# Patient Record
Sex: Female | Born: 1975 | Race: Black or African American | Hispanic: No | Marital: Single | Smoking: Former smoker
Health system: Southern US, Community
[De-identification: ages and names within clinical notes are randomized; demographics above are authoritative.]

## PROBLEM LIST (undated history)

## (undated) DIAGNOSIS — D649 Anemia, unspecified: Secondary | ICD-10-CM

## (undated) DIAGNOSIS — M1990 Unspecified osteoarthritis, unspecified site: Secondary | ICD-10-CM

## (undated) DIAGNOSIS — I10 Essential (primary) hypertension: Secondary | ICD-10-CM

## (undated) HISTORY — DX: Essential (primary) hypertension: I10

## (undated) HISTORY — PX: CHOLECYSTECTOMY: SHX55

## (undated) HISTORY — DX: Unspecified osteoarthritis, unspecified site: M19.90

## (undated) HISTORY — PX: ANKLE SURGERY: SHX546

## (undated) HISTORY — PX: FRACTURE SURGERY: SHX138

## (undated) HISTORY — DX: Anemia, unspecified: D64.9

---

## 1997-10-17 ENCOUNTER — Emergency Department (HOSPITAL_COMMUNITY): Admission: EM | Admit: 1997-10-17 | Discharge: 1997-10-17 | Payer: Self-pay | Admitting: Emergency Medicine

## 1997-10-27 ENCOUNTER — Emergency Department (HOSPITAL_COMMUNITY): Admission: EM | Admit: 1997-10-27 | Discharge: 1997-10-27 | Payer: Self-pay | Admitting: Emergency Medicine

## 1997-11-06 ENCOUNTER — Observation Stay (HOSPITAL_COMMUNITY): Admission: RE | Admit: 1997-11-06 | Discharge: 1997-11-07 | Payer: Self-pay | Admitting: General Surgery

## 1998-11-14 ENCOUNTER — Inpatient Hospital Stay (HOSPITAL_COMMUNITY): Admission: EM | Admit: 1998-11-14 | Discharge: 1998-11-17 | Payer: Self-pay | Admitting: Emergency Medicine

## 1998-11-14 ENCOUNTER — Encounter: Payer: Self-pay | Admitting: Emergency Medicine

## 1998-11-14 ENCOUNTER — Encounter: Payer: Self-pay | Admitting: Orthopedic Surgery

## 1999-01-07 ENCOUNTER — Encounter: Admission: RE | Admit: 1999-01-07 | Discharge: 1999-01-30 | Payer: Self-pay | Admitting: Orthopedic Surgery

## 2003-10-31 ENCOUNTER — Other Ambulatory Visit: Admission: RE | Admit: 2003-10-31 | Discharge: 2003-10-31 | Payer: Self-pay | Admitting: Obstetrics and Gynecology

## 2004-12-01 ENCOUNTER — Other Ambulatory Visit: Admission: RE | Admit: 2004-12-01 | Discharge: 2004-12-01 | Payer: Self-pay | Admitting: Obstetrics and Gynecology

## 2012-01-11 ENCOUNTER — Encounter: Payer: Self-pay | Admitting: Family Medicine

## 2012-01-11 ENCOUNTER — Ambulatory Visit (INDEPENDENT_AMBULATORY_CARE_PROVIDER_SITE_OTHER): Payer: BC Managed Care – PPO | Admitting: Family Medicine

## 2012-01-11 VITALS — BP 120/75 | HR 73 | Temp 97.9°F | Resp 16 | Ht 65.0 in | Wt 215.0 lb

## 2012-01-11 DIAGNOSIS — D573 Sickle-cell trait: Secondary | ICD-10-CM | POA: Insufficient documentation

## 2012-01-11 DIAGNOSIS — E559 Vitamin D deficiency, unspecified: Secondary | ICD-10-CM | POA: Insufficient documentation

## 2012-01-11 DIAGNOSIS — Z1231 Encounter for screening mammogram for malignant neoplasm of breast: Secondary | ICD-10-CM

## 2012-01-11 DIAGNOSIS — R61 Generalized hyperhidrosis: Secondary | ICD-10-CM

## 2012-01-11 DIAGNOSIS — E669 Obesity, unspecified: Secondary | ICD-10-CM

## 2012-01-11 DIAGNOSIS — Z0000 Encounter for general adult medical examination without abnormal findings: Secondary | ICD-10-CM

## 2012-01-11 DIAGNOSIS — D649 Anemia, unspecified: Secondary | ICD-10-CM | POA: Insufficient documentation

## 2012-01-11 LAB — POCT URINALYSIS DIPSTICK
Bilirubin, UA: NEGATIVE
Blood, UA: NEGATIVE
Glucose, UA: NEGATIVE
Ketones, UA: NEGATIVE
Leukocytes, UA: NEGATIVE
Nitrite, UA: NEGATIVE
Protein, UA: NEGATIVE
Spec Grav, UA: 1.01
Urobilinogen, UA: 0.2
pH, UA: 7

## 2012-01-11 LAB — BASIC METABOLIC PANEL
BUN: 13 mg/dL (ref 6–23)
CO2: 25 mEq/L (ref 19–32)
Calcium: 9.9 mg/dL (ref 8.4–10.5)
Chloride: 104 mEq/L (ref 96–112)
Creat: 0.73 mg/dL (ref 0.50–1.10)
Glucose, Bld: 86 mg/dL (ref 70–99)
Potassium: 4.4 mEq/L (ref 3.5–5.3)
Sodium: 138 mEq/L (ref 135–145)

## 2012-01-11 LAB — CBC WITH DIFFERENTIAL/PLATELET
Basophils Absolute: 0 10*3/uL (ref 0.0–0.1)
Eosinophils Absolute: 0.1 10*3/uL (ref 0.0–0.7)
Lymphs Abs: 1.8 10*3/uL (ref 0.7–4.0)
MCH: 21.1 pg — ABNORMAL LOW (ref 26.0–34.0)
Neutrophils Relative %: 67 % (ref 43–77)
Platelets: 401 10*3/uL — ABNORMAL HIGH (ref 150–400)
RBC: 5.11 MIL/uL (ref 3.87–5.11)
RDW: 17.5 % — ABNORMAL HIGH (ref 11.5–15.5)
WBC: 6.9 10*3/uL (ref 4.0–10.5)

## 2012-01-11 NOTE — Progress Notes (Signed)
Subjective:    Patient ID: Sandra Barker, female    DOB: 1975-12-11, 36 y.o.   MRN: [id number]  HPI  This 36 y.o. AA female is here for CPE (last PAP was 2012- negative). She is single and  works as Special educational needs teacher for [employer]. Her main concerns are nocturnal sweating and need for weight reduction. She has always been a "heavy sweater" but in the last few months, It has been significantly worse at night. She lives with her elderly grandmother who has early stages  of Alzheimer's Dementia; thermostat is set at 75 degrees at night. Pt is interested in referral to  Nutritionist for weight loss advise. She does not exercise because of lack of motivation and chronic  ankle/ joint  Pain.   Review of Systems  Constitutional: Positive for diaphoresis.  Musculoskeletal: Positive for joint swelling and arthralgias.  All other systems reviewed and are negative.       Objective:   Physical Exam  Nursing note and vitals reviewed. Constitutional: She is oriented to person, place, and time. She appears well-developed and well-nourished. No distress.  HENT:  Head: Normocephalic and atraumatic.  Right Ear: Hearing, tympanic membrane, external ear and ear canal normal.  Left Ear: Hearing, tympanic membrane, external ear and ear canal normal.  Nose: Nose normal. No nasal deformity or septal deviation.  Mouth/Throat: Uvula is midline, oropharynx is clear and moist and mucous membranes are normal.  Eyes: Conjunctivae normal, EOM and lids are normal. Pupils are equal, round, and reactive to light. No scleral icterus.  Neck: Normal range of motion. Neck supple. No thyromegaly present.  Cardiovascular: Normal rate, regular rhythm and intact distal pulses.  Exam reveals no gallop and no friction rub.   No murmur heard. Pulmonary/Chest: Effort normal and breath sounds normal. No respiratory distress. She exhibits no tenderness. Right breast exhibits no inverted nipple, no mass, no nipple  discharge, no skin change and no tenderness. Left breast exhibits no inverted nipple, no mass, no nipple discharge, no skin change and no tenderness. Breasts are symmetrical.       Bilaterally dense breasts with upper outer quadrant thickness (right > left)  Abdominal: Soft. Bowel sounds are normal. She exhibits no distension and no mass. There is no hepatosplenomegaly. There is no tenderness. There is no guarding and no CVA tenderness.  Genitourinary:       Deferred  Musculoskeletal: She exhibits tenderness.       Left ankle with swelling over medial malleolus and decreased ROM  Lymphadenopathy:    She has no cervical adenopathy.  Neurological: She is alert and oriented to person, place, and time. She has normal reflexes. No cranial nerve deficit. She exhibits normal muscle tone. Coordination normal.  Skin: Skin is warm and dry. No erythema. No pallor.       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 Her behavior is normal. Judgment and thought content normal.    Results for orders placed in visit on 01/11/12  POCT URINALYSIS DIPSTICK      Component Value Range   Color, UA yellow     Clarity, UA clear     Glucose, UA neg     Bilirubin, UA neg     Ketones, UA neg     Spec Grav, UA 1.010     Blood, UA neg  pH, UA 7.0     Protein, UA neg     Urobilinogen, UA 0.2     Nitrite, UA neg     Leukocytes, UA Negative           Assessment & Plan:   1. Routine general medical examination at a health care facility  POCT urinalysis dipstick  2. Diaphoresis  T4, Free, TSH, T3, Free, Basic metabolic panel  3. Unspecified vitamin D deficiency  Vitamin D, 25-hydroxy  4. Anemia - pt has Sickle Cell trait CBC with Differential  5. Obesity (BMI 35.0-39.9 without comorbidity)  Amb ref to Medical Nutrition Therapy-MNT Advised pt  to focus on "process" of weight loss, not the "outcome"; start increasing activity level by picking an activity that she can commit to 2 days/week  6. Other screening mammogram  MM Digital Screening

## 2012-01-11 NOTE — Patient Instructions (Signed)
Keeping You Healthy  Get These Tests 1. Blood Pressure- Have your blood pressure checked once a year by your health care provider.  Normal blood pressure is 120/80. 2. Weight- Have your body mass index (BMI) calculated to screen for obesity.  BMI is measure of body fat based on height and weight.  You can also calculate your own BMI at [email or web address].west-esparza.com/. 3. Cholesterol- Have your cholesterol checked every 5 years starting at age 38 then yearly starting at age 55. 4. Chlamydia, HIV, and other sexually transmitted diseases- Get screened every year until age 3, then within three months of each new sexual provider. 5. Pap Smear- Every 1-3 years; discuss with your health care provider. 6. Mammogram- Every year starting at age 68  Take these medicines  Calcium with Vitamin D-Your body needs 1200 mg of Calcium each day and [phone] IU of Vitamin D daily.  Your body can only absorb 500 mg of Calcium at a time so Calcium must be taken in 2 or 3 divided doses throughout the day.  Multivitamin with folic acid- Once daily if it is possible for you to become pregnant.  Get these Immunizations  Gardasil-Series of three doses; prevents HPV related illness such as genital warts and cervical cancer.  Menactra-Single dose; prevents meningitis.  Tetanus shot- Every 10 years.  Flu shot-Every year.  Take these steps 1. Do not smoke-Your healthcare provider can help you quit.  For tips on how to quit go to www.smokefree.gov or call 1-800 QUITNOW. 2. Be physically active- Exercise 5 days a week for at least 30 minutes.  If you are not already physically active, start slow and gradually work up to 30 minutes of moderate physical activity.  Examples of moderate activity include walking briskly, dancing, swimming, bicycling, etc. 3. Breast Cancer- A self breast exam every month is important for early detection of breast cancer.  For more information and instruction on self breast exams, ask your  healthcare provider or [email or web address]. 4. Eat a healthy diet- Eat a variety of healthy foods such as fruits, vegetables, whole grains, low fat milk, low fat cheeses, yogurt, lean meats, poultry and fish, beans, nuts, tofu, etc.  For more information go to www. Thenutritionsource.org 5. Drink alcohol in moderation- Limit alcohol intake to one drink or less per day. Never drink and drive. 6. Depression- Your emotional health is as important as your physical health.  If you're feeling down or losing interest in things you normally enjoy please talk to your healthcare provider about being screened for depression. 7. Dental visit- Brush and floss your teeth twice daily; visit your dentist twice a year. 8. Eye doctor- Get an eye exam at least every 2 years. 9. Helmet use- Always wear a helmet when riding a bicycle, motorcycle, rollerblading or skateboarding. 10. Safe sex- If you may be exposed to sexually transmitted infections, use a condom. 11. Seat belts- Seat belts can save your live; always wear one. 12. Smoke/Carbon Monoxide detectors- These detectors need to be installed on the appropriate level of your home. Replace batteries at least once a year. 13. Skin cancer- When out in the sun please cover up and use sunscreen 15 SPF or higher. 14. Violence- If anyone is threatening or hurting you, please tell your healthcare provider.   Exercise to Lose Weight Exercise and a healthy diet may help you lose weight. Your doctor may suggest specific exercises. EXERCISE IDEAS AND TIPS  Choose low-cost things you enjoy doing, such as walking, bicycling, or exercising to  workout videos.  Take stairs instead of the elevator.  Walk during your lunch break.  Park your car further away from work or school.  Go to a gym or an exercise class.  Start with 5 to 10 minutes of exercise each day. Build up to 30 minutes of exercise 4 to 6 days a week.  Wear shoes with good support  and comfortable clothes.  Stretch before and after working out.  Work out until you breathe harder and your heart beats faster.  Drink extra water when you exercise.  Do not do so much that you hurt yourself, feel dizzy, or get very short of breath. Exercises that burn about 150 calories:  Running 1  miles in 15 minutes.  Playing volleyball for 45 to 60 minutes.  Washing and waxing a car for 45 to 60 minutes.  Playing touch football for 45 minutes.  Walking 1  miles in 35 minutes.  Pushing a stroller 1  miles in 30 minutes.  Playing basketball for 30 minutes.  Raking leaves for 30 minutes.  Bicycling 5 miles in 30 minutes.  Walking 2 miles in 30 minutes.  Dancing for 30 minutes.  Shoveling snow for 15 minutes.  Swimming laps for 20 minutes.  Walking up stairs for 15 minutes.  Bicycling 4 miles in 15 minutes.  Gardening for 30 to 45 minutes.  Jumping rope for 15 minutes.  Washing windows or floors for 45 to 60 minutes. Document Released: 04/03/2010 Document Revised: 05/24/2011 Document Reviewed: 04/03/2010 [facility] Patient Information 2013 [place], [place].   Consider joining a fitness program like "[employer]" or [facility]

## 2012-01-12 LAB — VITAMIN D 25 HYDROXY (VIT D DEFICIENCY, FRACTURES): Vit D, 25-Hydroxy: 17 ng/mL — ABNORMAL LOW (ref 30–89)

## 2012-01-12 LAB — T3, FREE: T3, Free: 3.2 pg/mL (ref 2.3–4.2)

## 2012-01-12 LAB — T4, FREE: Free T4: 1.31 ng/dL (ref 0.80–1.80)

## 2012-01-12 LAB — TSH: TSH: 0.999 u[IU]/mL (ref 0.350–4.500)

## 2012-01-13 ENCOUNTER — Other Ambulatory Visit: Payer: Self-pay | Admitting: Family Medicine

## 2012-01-13 MED ORDER — ERGOCALCIFEROL 1.25 MG (50000 UT) PO CAPS: 50000.0000 [IU] | ORAL_CAPSULE | ORAL | Status: DC

## 2012-01-13 NOTE — Progress Notes (Signed)
Quick Note:  Please call pt and advise that the following labs are abnormal... Vitamin D def requires RX for supplement. This will be routed to pharmacy. Pt to take 1 capsule once a week and try to increase Vit D in diet (fish, mushrooms, some dairy products, eggs). Needs to get some sun exposure most days of the week. Also still anemic; get OTC multivitamin for women that has iron in it and take it daily with a meal and orange juice (to help increase iron absorption).  Other labs normal (kidney function and thyroid function).  Copy to pt. ______

## 2012-01-21 ENCOUNTER — Ambulatory Visit (HOSPITAL_COMMUNITY)
Admission: RE | Admit: 2012-01-21 | Discharge: 2012-01-21 | Disposition: A | Discharge status: Home / Self Care | Payer: BC Managed Care – PPO | Source: Ambulatory Visit | Attending: Family Medicine | Admitting: Family Medicine

## 2012-01-21 DIAGNOSIS — Z1231 Encounter for screening mammogram for malignant neoplasm of breast: Secondary | ICD-10-CM

## 2012-02-04 ENCOUNTER — Encounter: Payer: BC Managed Care – PPO | Attending: Family Medicine | Admitting: *Deleted

## 2012-02-04 ENCOUNTER — Encounter: Payer: Self-pay | Admitting: *Deleted

## 2012-02-04 VITALS — Ht 64.5 in | Wt 214.2 lb

## 2012-02-04 DIAGNOSIS — Z713 Dietary counseling and surveillance: Secondary | ICD-10-CM | POA: Insufficient documentation

## 2012-02-04 DIAGNOSIS — E669 Obesity, unspecified: Secondary | ICD-10-CM

## 2012-02-04 NOTE — Progress Notes (Signed)
Medical Nutrition Therapy:  Appt start time: 1000 end time:  1100.   Assessment:  Primary concerns today: weight management.   MEDICATIONS: none   DIETARY INTAKE:  Usual eating pattern includes 2-3 meals and 1-2 snacks per day.  Everyday foods include proteins, starches, some fruits and vegetables.  Avoided foods include eggs.    24-hr recall:  B ( AM): starbucks ice coffee.  Might have yogurt or biscuit .  Sometimes will skip Snk ( AM): not usually  L ( PM): eat out at [employer] salad or another takeout Snk ( PM): chips or grapes or other fruit D ( PM): may cook at home, steak , baked potato, and salad Snk ( PM): sweets in bed- cookies or ice cream Beverages: juice. Sunny D, koolaid or Crystal light Snacks when not hungry while watching tv or at work.  Evokes feelings of guilt.  No compensatory behaviors  Usual physical activity: not really.  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   Nutritional Diagnosis:  Boley-3.3 Overweight/obesity As related to decreased metabolism from chronic dieting an dmeal skipping coupled with limited adherance to internal hunger and satiety cues.  As evidenced by BMI of 36.    Intervention:  Nutrition counseling provided.  Sandra Barker is here because she wants to lose some weight.  She maintained a healthy weight for awhile, but as life got more busy and stressful she started to gain.  She has tried to lose weight in the past via various fad diets, but her weight has yo-yo'ed and eventually crept up.  Encouraged patient to reject traditional diet mentality of "good" vs "bad" foods.  There are no good and bad foods, but rather food is fuel that we needs for our bodies.  When we don't get enough fuel, our bodies suffer the metabolic consequences. Explained to her that by restricting calories too much over the years through  dieting or meal skipping she has actually caused her metabolism to decrease most likely.  Also fat storage increases with meal skipping.  Encouraged patient to eat whatever foods will satisfy them, regardless of their nutritional value.  We will discuss nutritional values of foods at a subsequent appointment.  Encouraged patient to honor their body's internal hunger and fullness cues.  Throughout the day, check in mentally and rate hunger.  Try not to eat when ravenous, but instead when slightly hungry.  Then choose food(s) that will be satisfying regardless of nutritional content.  Sit down to enjoy those foods.  Minimize distractions: turn off tv, put away books, work, Programmer, applications.  Make the meal last at least 20 minutes in order to give time to experience and register satiety.  Stop eating when full regardless of how much food is left on the plate.  Get more if still hungry.  The key is to honor fullness so throughout the meal, rate fullness factor and stop when comfortably full, but not stuffed.  Reminded patient that they can have any food they want, whenever they want, and however much they want.  Eventually the novelty will wear out and each food will be equal in terms of its emotional appeal.  This will be a learning process and some days more food will be eaten, some days less.  The key is to honor hunger and fullness without any feelings of guilt.  Pay attention to what the internal cues are,  rather than any external factors.  This concept of intuitive or mindful eating applies whenever a person craves a snack for emotional reasons.  Encouraged [name] to ask herself if she's actually hungry.  If she is, eat, but if she's not, ask what other thing might she be craving?  Is she bored, sad, stressed, etc.  Find an activity that will meet that emotional needs instead of food.     Monitoring/Evaluation:  Dietary intake, exercise, intuitive eating, and body weight in 1 month(s).

## 2012-02-04 NOTE — Patient Instructions (Addendum)
Goals:  Reject diet mentality- there are no good or bad foods Listen to internal hunger cues and honor those cues; don't wait until you're ravenous to eat Choose the food(s) you want Enjoy those foods.  Try to make meal last 20 minutes Stop eating when full.  Honor fullness cues too Do these things without any guilt or regret IF you find yourself craving something when you are not biologically hungry, ask yourself what else you might need: are you stressed, sad, bored?  Find an activity that will meet that emotional needs instead of food

## 2012-03-14 ENCOUNTER — Ambulatory Visit: Payer: BC Managed Care – PPO | Admitting: *Deleted

## 2012-06-23 ENCOUNTER — Encounter: Payer: Self-pay | Admitting: Family Medicine

## 2012-06-23 ENCOUNTER — Ambulatory Visit (INDEPENDENT_AMBULATORY_CARE_PROVIDER_SITE_OTHER): Payer: BC Managed Care – PPO | Admitting: Family Medicine

## 2012-06-23 ENCOUNTER — Ambulatory Visit: Payer: BC Managed Care – PPO | Admitting: Family Medicine

## 2012-06-23 VITALS — BP 116/78 | HR 106 | Temp 98.8°F | Resp 16 | Ht 64.5 in | Wt 213.6 lb

## 2012-06-23 DIAGNOSIS — N898 Other specified noninflammatory disorders of vagina: Secondary | ICD-10-CM

## 2012-06-23 DIAGNOSIS — E559 Vitamin D deficiency, unspecified: Secondary | ICD-10-CM

## 2012-06-23 LAB — POCT WET PREP WITH KOH
KOH Prep POC: POSITIVE
Trichomonas, UA: NEGATIVE
WBC Wet Prep HPF POC: NEGATIVE
Yeast Wet Prep HPF POC: NEGATIVE

## 2012-06-23 MED ORDER — FLUCONAZOLE 150 MG PO TABS: 150.0000 mg | ORAL_TABLET | Freq: Once | ORAL | Status: DC

## 2012-06-23 NOTE — Patient Instructions (Addendum)
Vitamin D deficiency diagnosed in Oct 2013 should have been corrected by now with the once-a-week supplement. Now, you  Will get ovet-the-counter Vitamin D3 2000 IU and take 1 capsule daily and try to get some sun exposure 10-15 minutes most days of the week.   Vaginal discharge appears normal; wet prep does show a few yeast elements. I will prescribe 1 tablet to treat yeast infection. Wear looser clothing this summer to minimize sweating in private areas.

## 2012-06-25 NOTE — Progress Notes (Signed)
S: This 37 y.o. AA female c/o clear vag d/c of 2-3 days duration. There is no itch or abnormal odor. Pt denies pelvic cramping or pain or abnormal bleeding. She denies dysuria or frequency or GI symptoms. She has a remote hx of anemia and Vit D def in 2019; she completed course of 16109 units of once-a- week Vit D. She currently takes no OTC supplements (MVI or Vit D).  ROS: As per HPI.  O:  Filed Vitals:   06/23/12 1027  BP: 116/78  Pulse: 106  Temp: 98.8 F (37.1 C)  Resp: 16   GEN: In NAD; WN,WD. HENT: /AT; EOMI w/ clear conj/ sclerae. EACs/Oroph normal.  GU: NEFG w/ mild erythema. No lesions on labia. Vag mucosa normal; scant mucoid d/c present on cervix. SKIN: W&D. No rashes. NEURO: A&O x 3; CNs intact. Nonfocal.  Results for orders placed in visit on 06/23/12  POCT WET PREP WITH KOH      Result Value Range   Trichomonas, UA Negative     Clue Cells Wet Prep HPF POC neg     Epithelial Wet Prep HPF POC 0-5     Yeast Wet Prep HPF POC neg     Bacteria Wet Prep HPF POC small     RBC Wet Prep HPF POC neg     WBC Wet Prep HPF POC neg     KOH Prep POC Positive      A/P: Vaginal discharge - Plan: Diflucan 150 mg x 1 dose. Advised pt to wear loose clothing in warmer months to avoid excessive sweating.  Unspecified vitamin D deficiency- get  OTC Vit D 2000 IU and take 1 capsule daily as well as sun exposure most days of the week.

## 2013-07-19 ENCOUNTER — Encounter: Payer: Self-pay | Admitting: Family Medicine

## 2013-07-19 ENCOUNTER — Ambulatory Visit (INDEPENDENT_AMBULATORY_CARE_PROVIDER_SITE_OTHER): Payer: BC Managed Care – PPO | Admitting: Family Medicine

## 2013-07-19 VITALS — BP 142/69 | HR 84 | Temp 97.5°F | Resp 16 | Ht 65.0 in | Wt 217.0 lb

## 2013-07-19 DIAGNOSIS — I10 Essential (primary) hypertension: Secondary | ICD-10-CM

## 2013-07-19 MED ORDER — HYDROCHLOROTHIAZIDE 25 MG PO TABS: ORAL_TABLET | ORAL | Status: DC

## 2013-07-19 NOTE — Progress Notes (Signed)
S:  This 38 y.o. AA female is here for evaluation of elevated BP. She has episode of dizziness at work; BP was measured at 140-149/78-98. No other symptoms reported- no diaphoresis, fatigue, vision disturbance, CP or tightness, palpitations,edema, SOB or cough, HA, numbness or syncope. Pt does not exercise; she joined [employer] but is not going. She acknowledges unhealthy eating at night; has a "sweet tooth". Limits caffeine and adds no salt. Family hx : + for HTN.  Patient Active Problem List   Diagnosis Date Noted  . Anemia 01/11/2012  . Sickle cell trait 01/11/2012  . Unspecified vitamin D deficiency 01/11/2012  . Obesity (BMI 35.0-39.9 without comorbidity) 01/11/2012   PMHx, Surg Hx, Soc and Fam Hx reviewed.  ROS: As per HPI.  O: Filed Vitals:   07/19/13 1337  BP: 142/69  Pulse: 84  Temp: 97.5 F (36.4 C)  Resp: 16   GEN: In NAD; WN,WD. HENT: /AT; EOMI w/ clear conj/sclerae. Otherwise unremarkable. COR: RRR. LUNGS: Normal resp rate and effort. SKIN: W&D; intact w/o diaphoresis, erythema or rashes. NEURO: A&O x 3: CNs intact. Nonfocal.  A/P: HTN, goal below 140/80- RX: HCTZ 25 mg 1/2 tablet every morning.  Encouraged making 1 small change in lifestyle. Start exercising once a week for 2- 4 weeks then increase that to 2 times per week. Make healthy choices for snacking; observe portion sizes. Pt will meet w/ trainer at fitness center.  RTC in ~3 months for CPE/fasting labs.

## 2013-07-19 NOTE — Patient Instructions (Signed)
Hypertension As your heart beats, it forces blood through your arteries. This force is your blood pressure. If the pressure is too high, it is called hypertension (HTN) or high blood pressure. HTN is dangerous because you may have it and not know it. High blood pressure may mean that your heart has to work harder to pump blood. Your arteries may be narrow or stiff. The extra work puts you at risk for heart disease, stroke, and other problems.  Blood pressure consists of two numbers, a higher number over a lower, 110/72, for example. It is stated as "110 over 72." The ideal is below 120 for the top number (systolic) and under 80 for the bottom (diastolic). Write down your blood pressure today. You should pay close attention to your blood pressure if you have certain conditions such as:  Heart failure.  Prior heart attack.  Diabetes  Chronic kidney disease.  Prior stroke.  Multiple risk factors for heart disease. To see if you have HTN, your blood pressure should be measured while you are seated with your arm held at the level of the heart. It should be measured at least twice. A one-time elevated blood pressure reading (especially in the Emergency Department) does not mean that you need treatment. There may be conditions in which the blood pressure is different between your right and left arms. It is important to see your caregiver soon for a recheck. Most people have essential hypertension which means that there is not a specific cause. This type of high blood pressure may be lowered by changing lifestyle factors such as:  Stress.  Smoking.  Lack of exercise.  Excessive weight.  Drug/tobacco/alcohol use.  Eating less salt. Most people do not have symptoms from high blood pressure until it has caused damage to the body. Effective treatment can often prevent, delay or reduce that damage. TREATMENT  When a cause has been identified, treatment for high blood pressure is directed at the  cause. There are a large number of medications to treat HTN. These fall into several categories, and your caregiver will help you select the medicines that are best for you. Medications may have side effects. You should review side effects with your caregiver. If your blood pressure stays high after you have made lifestyle changes or started on medicines,   Your medication(s) may need to be changed.  Other problems may need to be addressed.  Be certain you understand your prescriptions, and know how and when to take your medicine.  Be sure to follow up with your caregiver within the time frame advised (usually within two weeks) to have your blood pressure rechecked and to review your medications.  If you are taking more than one medicine to lower your blood pressure, make sure you know how and at what times they should be taken. Taking two medicines at the same time can result in blood pressure that is too low. SEEK IMMEDIATE MEDICAL CARE IF:  You develop a severe headache, blurred or changing vision, or confusion.  You have unusual weakness or numbness, or a faint feeling.  You have severe chest or abdominal pain, vomiting, or breathing problems. MAKE SURE YOU:   Understand these instructions.  Will watch your condition.  Will get help right away if you are not doing well or get worse. Document Released: 03/01/2005 Document Revised: 05/24/2011 Document Reviewed: 10/20/2007 [facility] Patient Information 2014 [place].     Exercise to Stay Healthy Exercise helps you become and stay healthy. EXERCISE  IDEAS AND TIPS Choose exercises that:  You enjoy.  Fit into your day. You do not need to exercise really hard to be healthy. You can do exercises at a slow or medium level and stay healthy. You can:  Stretch before and after working out.  Try yoga, Pilates, or tai chi.  Lift weights.  Walk fast, swim, jog, run, climb stairs, bicycle, dance, or rollerskate.  Take  aerobic classes. Exercises that burn about 150 calories:  Running 1  miles in 15 minutes.  Playing volleyball for 45 to 60 minutes.  Washing and waxing a car for 45 to 60 minutes.  Playing touch football for 45 minutes.  Walking 1  miles in 35 minutes.  Pushing a stroller 1  miles in 30 minutes.  Playing basketball for 30 minutes.  Raking leaves for 30 minutes.  Bicycling 5 miles in 30 minutes.  Walking 2 miles in 30 minutes.  Dancing for 30 minutes.  Shoveling snow for 15 minutes.  Swimming laps for 20 minutes.  Walking up stairs for 15 minutes.  Bicycling 4 miles in 15 minutes.  Gardening for 30 to 45 minutes.  Jumping rope for 15 minutes.  Washing windows or floors for 45 to 60 minutes. Document Released: 04/03/2010 Document Revised: 05/24/2011 Document Reviewed: 04/03/2010 [facility] Patient Information 2014 [place], [place].

## 2013-08-17 ENCOUNTER — Encounter: Payer: BC Managed Care – PPO | Admitting: Family Medicine

## 2013-10-24 ENCOUNTER — Other Ambulatory Visit: Payer: Self-pay | Admitting: Family Medicine

## 2013-10-24 ENCOUNTER — Ambulatory Visit (INDEPENDENT_AMBULATORY_CARE_PROVIDER_SITE_OTHER): Payer: BC Managed Care – PPO | Admitting: Family Medicine

## 2013-10-24 ENCOUNTER — Encounter: Payer: Self-pay | Admitting: Family Medicine

## 2013-10-24 VITALS — BP 110/76 | HR 76 | Temp 98.7°F | Resp 16 | Ht 65.0 in | Wt 212.8 lb

## 2013-10-24 DIAGNOSIS — Z1231 Encounter for screening mammogram for malignant neoplasm of breast: Secondary | ICD-10-CM

## 2013-10-24 DIAGNOSIS — E559 Vitamin D deficiency, unspecified: Secondary | ICD-10-CM

## 2013-10-24 DIAGNOSIS — Z0000 Encounter for general adult medical examination without abnormal findings: Secondary | ICD-10-CM

## 2013-10-24 DIAGNOSIS — D509 Iron deficiency anemia, unspecified: Secondary | ICD-10-CM

## 2013-10-24 DIAGNOSIS — I10 Essential (primary) hypertension: Secondary | ICD-10-CM

## 2013-10-24 LAB — COMPLETE METABOLIC PANEL WITH GFR
ALBUMIN: 4.5 g/dL (ref 3.5–5.2)
ALK PHOS: 73 U/L (ref 39–117)
ALT: 14 U/L (ref 0–35)
AST: 17 U/L (ref 0–37)
BUN: 12 mg/dL (ref 6–23)
CALCIUM: 9.6 mg/dL (ref 8.4–10.5)
CHLORIDE: 104 meq/L (ref 96–112)
CO2: 25 mEq/L (ref 19–32)
Creat: 0.83 mg/dL (ref 0.50–1.10)
GFR, Est African American: >89 mL/min
GFR, Est Non African American: 88 mL/min
GLUCOSE: 120 mg/dL — AB (ref 70–99)
Potassium: 4.1 mEq/L (ref 3.5–5.3)
SODIUM: 139 meq/L (ref 135–145)
TOTAL PROTEIN: 7.2 g/dL (ref 6.0–8.3)
Total Bilirubin: 0.7 mg/dL (ref 0.2–1.2)

## 2013-10-24 LAB — POCT URINALYSIS DIPSTICK
Bilirubin, UA: NEGATIVE
Glucose, UA: NEGATIVE
Ketones, UA: NEGATIVE
Leukocytes, UA: NEGATIVE
Nitrite, UA: NEGATIVE
PH UA: 5.5
PROTEIN UA: NEGATIVE
Spec Grav, UA: 1.02
Urobilinogen, UA: 0.2

## 2013-10-24 LAB — CBC WITH DIFFERENTIAL/PLATELET
Basophils Absolute: 0 10*3/uL (ref 0.0–0.1)
Basophils Relative: 0 % (ref 0–1)
EOS ABS: 0.1 10*3/uL (ref 0.0–0.7)
EOS PCT: 2 % (ref 0–5)
HCT: 33.7 % — ABNORMAL LOW (ref 36.0–46.0)
Hemoglobin: 11.1 g/dL — ABNORMAL LOW (ref 12.0–15.0)
LYMPHS ABS: 1.5 10*3/uL (ref 0.7–4.0)
Lymphocytes Relative: 22 % (ref 12–46)
MCH: 21.6 pg — AB (ref 26.0–34.0)
MCHC: 32.9 g/dL (ref 30.0–36.0)
MCV: 65.6 fL — AB (ref 78.0–100.0)
Monocytes Absolute: 0.4 10*3/uL (ref 0.1–1.0)
Monocytes Relative: 6 % (ref 3–12)
Neutro Abs: 4.8 10*3/uL (ref 1.7–7.7)
Neutrophils Relative %: 70 % (ref 43–77)
Platelets: 400 10*3/uL (ref 150–400)
RBC: 5.14 MIL/uL — ABNORMAL HIGH (ref 3.87–5.11)
RDW: 17.5 % — AB (ref 11.5–15.5)
WBC: 6.9 10*3/uL (ref 4.0–10.5)

## 2013-10-24 LAB — LIPID PANEL
CHOL/HDL RATIO: 3.6 ratio
Cholesterol: 164 mg/dL (ref 0–200)
HDL: 45 mg/dL (ref >39)
LDL Cholesterol: 103 mg/dL — ABNORMAL HIGH (ref 0–99)
Triglycerides: 79 mg/dL (ref <150)
VLDL: 16 mg/dL (ref 0–40)

## 2013-10-24 NOTE — Patient Instructions (Signed)
Keeping You Healthy  Get These Tests 1. Blood Pressure- Have your blood pressure checked once a year by your health care provider.  Normal blood pressure is 120/80. 2. Weight- Have your body mass index (BMI) calculated to screen for obesity.  BMI is measure of body fat based on height and weight.  You can also calculate your own BMI at [email or web address]://www.west-esparza.com/. 3. Cholesterol- Have your cholesterol checked every 5 years starting at age 40 then yearly starting at age 70. 4. Chlamydia, HIV, and other sexually transmitted diseases- Get screened every year until age 1, then within three months of each new sexual provider. 5. Pap Smear- Every 1-3 years; discuss with your health care provider. 6. Mammogram- Every year starting at age 60  Take these medicines  Calcium with Vitamin D-Your body needs 1200 mg of Calcium each day and [phone] IU of Vitamin D daily.  Your body can only absorb 500 mg of Calcium at a time so Calcium must be taken in 2 or 3 divided doses throughout the day.  Multivitamin with folic acid- Once daily if it is possible for you to become pregnant.  Get these Immunizations  Gardasil-Series of three doses; prevents HPV related illness such as genital warts and cervical cancer.  Menactra-Single dose; prevents meningitis.  Tetanus shot- Every 10 years.  Flu shot-Every year.  Take these steps 1. Do not smoke-Your healthcare provider can help you quit.  For tips on how to quit go to www.smokefree.gov or call 1-800 QUITNOW. 2. Be physically active- Exercise 5 days a week for at least 30 minutes.  If you are not already physically active, start slow and gradually work up to 30 minutes of moderate physical activity.  Examples of moderate activity include walking briskly, dancing, swimming, bicycling, etc. 3. Breast Cancer- A self breast exam every month is important for early detection of breast cancer.  For more information and instruction on self breast exams, ask your  healthcare provider or [email or web address]. 4. Eat a healthy diet- Eat a variety of healthy foods such as fruits, vegetables, whole grains, low fat milk, low fat cheeses, yogurt, lean meats, poultry and fish, beans, nuts, tofu, etc.  For more information go to www. Thenutritionsource.org 5. Drink alcohol in moderation- Limit alcohol intake to one drink or less per day. Never drink and drive. 6. Depression- Your emotional health is as important as your physical health.  If you're feeling down or losing interest in things you normally enjoy please talk to your healthcare provider about being screened for depression. 7. Dental visit- Brush and floss your teeth twice daily; visit your dentist twice a year. 8. Eye doctor- Get an eye exam at least every 2 years. 9. Helmet use- Always wear a helmet when riding a bicycle, motorcycle, rollerblading or skateboarding. 10. Safe sex- If you may be exposed to sexually transmitted infections, use a condom. 11. Seat belts- Seat belts can save your live; always wear one. 12. Smoke/Carbon Monoxide detectors- These detectors need to be installed on the appropriate level of your home. Replace batteries at least once a year. 13. Skin cancer- When out in the sun please cover up and use sunscreen 15 SPF or higher. 14. Violence- If anyone is threatening or hurting you, please tell your healthcare provider.   Resume over-the-counter Vitamin D3  2000 units  1 capsule daily and try to get 10-15 minutes of sun exposure most days of the week.   Below is some information about healthy "lifestyle"-  Mediterranean Diet  Why follow it? Research shows.   Those who follow the Mediterranean diet have a reduced risk of heart disease    The diet is associated with a reduced incidence of Parkinson's and Alzheimer's diseases   People following the diet may have longer life expectancies and lower rates of chronic diseases    The Dietary Guidelines for  Americans recommends the Mediterranean diet as an eating plan to promote health and prevent disease  What Is the Mediterranean Diet?    Healthy eating plan based on typical foods and recipes of Mediterranean-style cooking   The diet is primarily a plant based diet; these foods should make up a majority of meals   Starches - Plant based foods should make up a majority of meals - They are an important sources of vitamins, minerals, energy, antioxidants, and fiber - Choose whole grains, foods high in fiber and minimally processed items  - Typical grain sources include wheat, oats, barley, corn, brown rice, bulgar, farro, millet, polenta, couscous  - Various types of beans include chickpeas, lentils, fava beans, black beans, white beans   Fruits  Veggies - Large quantities of antioxidant rich fruits & veggies; 6 or more servings  - Vegetables can be eaten raw or lightly drizzled with oil and cooked  - Vegetables common to the traditional Mediterranean Diet include: artichokes, arugula, beets, broccoli, brussel sprouts, cabbage, carrots, celery, collard greens, cucumbers, eggplant, kale, leeks, lemons, lettuce, mushrooms, okra, onions, peas, peppers, potatoes, pumpkin, radishes, rutabaga, shallots, spinach, sweet potatoes, turnips, zucchini - Fruits common to the Mediterranean Diet include: apples, apricots, avocados, cherries, clementines, dates, figs, grapefruits, grapes, melons, nectarines, oranges, peaches, pears, pomegranates, strawberries, tangerines  Fats - Replace butter and margarine with healthy oils, such as olive oil, canola oil, and tahini  - Limit nuts to no more than a handful a day  - Nuts include walnuts, almonds, pecans, pistachios, pine nuts  - Limit or avoid candied, honey roasted or heavily salted nuts - Olives are central to the [employer] - can be eaten whole or used in a variety of dishes   Meats Protein - Limiting red meat: no more than a few times a month - When  eating red meat: choose lean cuts and keep the portion to the size of deck of cards - Eggs: approx. 0 to 4 times a week  - Fish and lean poultry: at least 2 a week  - Healthy protein sources include, chicken, [place], lean beef, lamb - Increase intake of seafood such as tuna, salmon, trout, mackerel, shrimp, scallops - Avoid or limit high fat processed meats such as sausage and bacon  Dairy - Include moderate amounts of low fat dairy products  - Focus on healthy dairy such as fat free yogurt, skim milk, low or reduced fat cheese - Limit dairy products higher in fat such as whole or 2% milk, cheese, ice cream  Alcohol - Moderate amounts of red wine is ok  - No more than 5 oz daily for women (all ages) and men older than age 28  - No more than 10 oz of wine daily for men younger than 22  Other - Limit sweets and other desserts  - Use herbs and spices instead of salt to flavor foods  - Herbs and spices common to the traditional Mediterranean Diet include: basil, bay leaves, chives, cloves, cumin, fennel, garlic, lavender, marjoram, mint, oregano, parsley, pepper, rosemary, sage, savory, sumac, tarragon, thyme   It's not just a  diet, it's a lifestyle:    The Mediterranean diet includes lifestyle factors typical of those in the region    Foods, drinks and meals are best eaten with others and savored   Daily physical activity is important for overall good health   This could be strenuous exercise like running and aerobics   This could also be more leisurely activities such as walking, housework, yard-work, or taking the stairs   Moderation is the key; a balanced and healthy diet accommodates most foods and drinks   Consider portion sizes and frequency of consumption of certain foods   Meal Ideas & Options:    Breakfast:  o Whole wheat toast or whole wheat English muffins with peanut butter & hard boiled egg o Steel cut oats topped with apples & cinnamon and skim milk  o Fresh fruit: banana,  strawberries, melon, berries, peaches  o Smoothies: strawberries, bananas, greek yogurt, peanut butter o Low fat greek yogurt with blueberries and granola  o Egg white omelet with spinach and mushrooms o Breakfast couscous: whole wheat couscous, apricots, skim milk, cranberries    Sandwiches:  o Hummus and grilled vegetables (peppers, zucchini, squash) on whole wheat bread   o Grilled chicken on whole wheat pita with lettuce, tomatoes, cucumbers or tzatziki  o Tuna salad on whole wheat bread: tuna salad made with greek yogurt, olives, red peppers, capers, green onions o Garlic rosemary lamb pita: lamb sauted with garlic, rosemary, salt & pepper; add lettuce, cucumber, greek yogurt to pita - flavor with lemon juice and black pepper    Seafood:  o Mediterranean grilled salmon, seasoned with garlic, basil, parsley, lemon juice and black pepper o Shrimp, lemon, and spinach whole-grain pasta salad made with low fat greek yogurt  o Seared scallops with lemon orzo  o Seared tuna steaks seasoned salt, pepper, coriander topped with tomato mixture of olives, tomatoes, olive oil, minced garlic, parsley, green onions and cappers    Meats:  o Herbed greek chicken salad with kalamata olives, cucumber, feta  o Red bell peppers stuffed with spinach, bulgur, lean ground beef (or lentils) & topped with feta   o Kebabs: skewers of chicken, tomatoes, onions, zucchini, squash  o [place] burgers: made with red onions, mint, dill, lemon juice, feta cheese topped with roasted red peppers   Vegetarian o Cucumber salad: cucumbers, artichoke hearts, celery, red onion, feta cheese, tossed in olive oil & lemon juice  o Hummus and whole grain pita points with a greek salad (lettuce, tomato, feta, olives, cucumbers, red onion) o Lentil soup with celery, carrots made with vegetable broth, garlic, salt and pepper  o Tabouli salad: parsley, bulgur, mint, scallions, cucumbers, tomato, radishes, lemon juice, olive oil, salt and  pepper. o

## 2013-10-24 NOTE — Progress Notes (Signed)
Subjective:    Patient ID: Sandra Barker, female    DOB: 12/28/1975, 38 y.o.   MRN: [id number]  HPI  This 38 y.o. AA female is here for CPE; she is menstruating so she will return for PAP/pelvic exam. HTN is well controlled and pt is compliant w/ medication w/o adverse effects. Pt has improved nutrition but is not exercising; she has managed to lose 5 lbs.  HCM: PAP- 2012 (negative).           MMG- Nov 2013 (negative).           IMM- Current.           Vision- Biannually.           Dental- Current.             Patient Active Problem List   Diagnosis Date Noted  . HTN, goal below 140/80 07/19/2013  . Anemia 01/11/2012  . Sickle cell trait 01/11/2012  . Unspecified vitamin D deficiency 01/11/2012  . Obesity (BMI 35.0-39.9 without comorbidity) 01/11/2012    Prior to Admission medications   Medication Sig Start Date End Date Taking? Authorizing Provider  ferrous sulfate 325 (65 FE) MG tablet Take 45 mg by mouth daily with breakfast.   Yes Historical Provider, MD  hydrochlorothiazide (HYDRODIURIL) 25 MG tablet Take 1/2 tablet every morning to lower blood pressure. 07/19/13  Yes Maurice March, MD  Multiple Vitamin (MULTIVITAMIN) capsule Take 1 capsule by mouth daily.   Yes Historical Provider, MD    No Known Allergies   Past Surgical History  Procedure Laterality Date  . Cholecystectomy    . Ankle surgery      History   Social History  . Marital Status: Single    Spouse Name: N/A    Number of Children: N/A  . Years of Education: N/A   Occupational History  . staffing coordinator    Social History Main Topics  . Smoking status: Never Smoker   . Smokeless tobacco: Not on file  . Alcohol Use: Yes     Comment: occas  . Drug Use: No  . Sexual Activity: Yes    Partners: Male   Other Topics Concern  . Not on file   Social History Narrative   Single.     Family History  Problem Relation Age of Onset  . COPD Mother   . Hypertension Mother   . Heart disease  Father   . Stroke Father   . Hypertension Father   . Hyperlipidemia Maternal Grandmother   . Hypertension Maternal Grandmother   . Heart disease Maternal Grandfather   . Hypertension Maternal Grandfather   . Diabetes Paternal Grandmother     Review of Systems  Constitutional: Negative.   HENT: Negative.   Eyes: Negative.   Respiratory: Negative.   Cardiovascular: Negative.   Gastrointestinal: Negative.   Endocrine: Negative.   Genitourinary: Positive for vaginal discharge.  Musculoskeletal: Negative.   Skin: Negative.   Allergic/Immunologic: Negative.   Neurological: Negative.   Hematological: Negative.   Psychiatric/Behavioral: Negative.       Objective:   Physical Exam  Nursing note and vitals reviewed. Constitutional: She is oriented to person, place, and time. Vital signs are normal. She appears well-developed and well-nourished. No distress.  HENT:  Head: Normocephalic and atraumatic.  Right Ear: Hearing, tympanic membrane, external ear and ear canal normal.  Left Ear: Hearing, tympanic membrane, external ear and ear canal normal.  Nose: Nose normal. No nasal deformity or septal  deviation.  Mouth/Throat: Uvula is midline, oropharynx is clear and moist and mucous membranes are normal. No oral lesions. Normal dentition. No dental caries.  Eyes: Conjunctivae, EOM and lids are normal. Pupils are equal, round, and reactive to light. No scleral icterus.  Fundoscopic exam:      The right eye shows no arteriolar narrowing, no AV nicking and no papilledema. The right eye shows red reflex.       The left eye shows no arteriolar narrowing, no AV nicking and no papilledema. The left eye shows red reflex.  Neck: Trachea normal, normal range of motion, full passive range of motion without pain and phonation normal. Neck supple. No spinous process tenderness and no muscular tenderness present. No mass and no thyromegaly present.  Cardiovascular: Normal rate, regular rhythm, S1 normal,  S2 normal, normal heart sounds, intact distal pulses and normal pulses.   No extrasystoles are present. PMI is not displaced.  Exam reveals no gallop and no friction rub.   No murmur heard. Pulmonary/Chest: Effort normal and breath sounds normal. No respiratory distress. She has no decreased breath sounds. She has no wheezes. Right breast exhibits no inverted nipple, no mass, no nipple discharge, no skin change and no tenderness. Left breast exhibits no inverted nipple, no mass, no nipple discharge, no skin change and no tenderness. Breasts are symmetrical.  Abdominal: Soft. Normal appearance and bowel sounds are normal. She exhibits no distension and no mass. There is no hepatosplenomegaly. There is no tenderness. There is no guarding and no CVA tenderness.  Musculoskeletal:       Cervical back: Normal.       Thoracic back: Normal.       Lumbar back: Normal.  Remainder of exam normal.  Lymphadenopathy:       Head (right side): No submental, no submandibular, no tonsillar, no preauricular, no posterior auricular and no occipital adenopathy present.       Head (left side): No submental, no submandibular, no tonsillar, no preauricular, no posterior auricular and no occipital adenopathy present.    She has no cervical adenopathy.    She has no axillary adenopathy.       Right: No inguinal and no supraclavicular adenopathy present.       Left: No inguinal and no supraclavicular adenopathy present.  Neurological: She is alert and oriented to person, place, and time. She has normal strength. She displays no atrophy. No cranial nerve deficit or sensory deficit. She exhibits normal muscle tone. Coordination and gait normal.  Reflex Scores:      Tricep reflexes are 2+ on the right side and 2+ on the left side.      Bicep reflexes are 2+ on the right side and 2+ on the left side.      Brachioradialis reflexes are 2+ on the right side and 2+ on the left side.      Patellar reflexes are 2+ on the right side  and 2+ on the left side.      Achilles reflexes are 2+ on the right side and 2+ on the left side. Skin: Skin is warm, dry and intact. No ecchymosis, no lesion and no rash noted. She is not diaphoretic. No cyanosis or erythema. Nails show no clubbing.  Large hyperpigmentation/birthmark on L upper back and flank area.  Psychiatric: Her speech is normal and behavior is normal. Judgment and thought content normal. Cognition and memory are normal.    Results for orders placed in visit on 10/24/13  POCT URINALYSIS  DIPSTICK      Result Value Ref Range   Color, UA yellow     Clarity, UA clear     Glucose, UA neg     Bilirubin, UA neg     Ketones, UA neg     Spec Grav, UA 1.020     Blood, UA large     pH, UA 5.5     Protein, UA neg     Urobilinogen, UA 0.2     Nitrite, UA neg     Leukocytes, UA Negative        Assessment & Plan:  Routine general medical examination at a health care facility - Plan: Lipid panel, POCT urinalysis dipstick  Unspecified vitamin D deficiency - Resume OTC Vit D3 2000 units daily. Plan: Vitamin D, 25-hydroxy, COMPLETE METABOLIC PANEL WITH GFR  Iron deficiency anemia - Plan: CBC with Differential, COMPLETE METABOLIC PANEL WITH GFR, POCT urinalysis dipstick  Essential hypertension- Stable and controlled on current medication. Actively work on weight reduction.

## 2013-10-25 LAB — VITAMIN D 25 HYDROXY (VIT D DEFICIENCY, FRACTURES): Vit D, 25-Hydroxy: 30 ng/mL (ref 30–89)

## 2013-12-05 ENCOUNTER — Ambulatory Visit: Payer: BC Managed Care – PPO | Admitting: Family Medicine

## 2014-04-02 ENCOUNTER — Encounter: Payer: Self-pay | Admitting: Family Medicine

## 2014-04-02 ENCOUNTER — Ambulatory Visit (INDEPENDENT_AMBULATORY_CARE_PROVIDER_SITE_OTHER): Payer: BLUE CROSS/BLUE SHIELD | Admitting: Family Medicine

## 2014-04-02 VITALS — BP 130/78 | HR 69 | Temp 98.4°F | Resp 16 | Ht 64.5 in | Wt 207.4 lb

## 2014-04-02 DIAGNOSIS — Z01419 Encounter for gynecological examination (general) (routine) without abnormal findings: Secondary | ICD-10-CM

## 2014-04-02 DIAGNOSIS — E669 Obesity, unspecified: Secondary | ICD-10-CM

## 2014-04-02 DIAGNOSIS — R739 Hyperglycemia, unspecified: Secondary | ICD-10-CM

## 2014-04-02 DIAGNOSIS — Z124 Encounter for screening for malignant neoplasm of cervix: Secondary | ICD-10-CM

## 2014-04-02 DIAGNOSIS — I10 Essential (primary) hypertension: Secondary | ICD-10-CM

## 2014-04-02 LAB — POCT GLYCOSYLATED HEMOGLOBIN (HGB A1C): Hemoglobin A1C: 5.6

## 2014-04-02 LAB — HEMOGLOBIN A1C
HEMOGLOBIN A1C: 5.7 % — AB (ref <5.7)
Mean Plasma Glucose: 117 mg/dL — ABNORMAL HIGH (ref <117)

## 2014-04-02 MED ORDER — HYDROCHLOROTHIAZIDE 25 MG PO TABS: ORAL_TABLET | ORAL | Status: DC

## 2014-04-02 NOTE — Patient Instructions (Signed)
Your A1c test (screening test for Diabetes) is in the normal range. With weight loss and good nutrition, it should remain in the normal range. Good steady weight loss is 1-2 pounds per month. Remember to add in some strength training to tone muscles and increase calorie-burning and improve metabolic function of your body.  GOOD LUCK!!!

## 2014-04-02 NOTE — Progress Notes (Signed)
Subjective:    Patient ID: Sandra Barker, female    DOB: December 16, 1975, 39 y.o.   MRN: [id number]  HPI This 39 y.o. Female si here for HTN follow-up and medication refill. She is doing well w/ medication and has no adverse reactions. She has started an exercise program w/ a friend; they are going to fitness center once a week and plan to increase frequency to twice weekly.  PAP is due; menses is normal and pt denies pelvic pain or abnormal vag bleeding or discharge.   Pt has hx of elevated blood sugar and needs screening for DM. She denies polyuria, polydipsia or polyphagia.   Patient Active Problem List   Diagnosis Date Noted  . HTN, goal below 140/80 07/19/2013  . Anemia 01/11/2012  . Sickle cell trait 01/11/2012  . Unspecified vitamin D deficiency 01/11/2012  . Obesity (BMI 35.0-39.9 without comorbidity) 01/11/2012    Prior to Admission medications   Medication Sig Start Date End Date Taking? Authorizing Provider  ferrous sulfate 325 (65 FE) MG tablet Take 45 mg by mouth daily with breakfast.   Yes Historical Provider, MD  hydrochlorothiazide (HYDRODIURIL) 25 MG tablet Take 1/2 tablet every morning to lower blood pressure.   Yes Maurice March, MD  Multiple Vitamin (MULTIVITAMIN) capsule Take 1 capsule by mouth daily.   Yes Historical Provider, MD    History   Social History  . Marital Status: Single    Spouse Name: N/A    Number of Children: N/A  . Years of Education: N/A   Occupational History  . staffing coordinator    Social History Main Topics  . Smoking status: Never Smoker   . Smokeless tobacco: Not on file  . Alcohol Use: Yes     Comment: occas  . Drug Use: No  . Sexual Activity:    Partners: Male   Other Topics Concern  . Not on file   Social History Narrative   Single.     Family History  Problem Relation Age of Onset  . COPD Mother   . Hypertension Mother   . Heart disease Father   . Stroke Father   . Hypertension Father   .  Hyperlipidemia Maternal Grandmother   . Hypertension Maternal Grandmother   . Heart disease Maternal Grandfather   . Hypertension Maternal Grandfather   . Diabetes Paternal Grandmother     Review of Systems  Constitutional: Negative.   Eyes: Negative.   Respiratory: Negative.   Cardiovascular: Negative.   Gastrointestinal: Negative.   Endocrine: Negative.   Genitourinary: Negative.   Neurological: Negative.   Psychiatric/Behavioral: Negative.        Objective:   Physical Exam  Constitutional: She is oriented to person, place, and time. She appears well-developed and well-nourished. No distress.  HENT:  Head: Normocephalic and atraumatic.  Right Ear: External ear normal.  Left Ear: External ear normal.  Nose: Nose normal.  Mouth/Throat: Oropharynx is clear and moist.  Eyes: Conjunctivae and EOM are normal. Pupils are equal, round, and reactive to light. No scleral icterus.  Neck: Normal range of motion. Neck supple. No thyromegaly present.  Cardiovascular: Normal rate, regular rhythm and normal heart sounds.   Pulmonary/Chest: Effort normal and breath sounds normal. No respiratory distress. She has no wheezes.  Genitourinary: Uterus normal. There is no rash, tenderness or lesion on the right labia. There is no rash, tenderness or lesion on the left labia. Cervix exhibits discharge. Cervix exhibits no motion tenderness and no friability. Right  adnexum displays no mass, no tenderness and no fullness. Left adnexum displays no mass, no tenderness and no fullness. No erythema, tenderness or bleeding in the vagina. No signs of injury around the vagina. Vaginal discharge found.  Musculoskeletal: Normal range of motion. She exhibits no edema or tenderness.  Lymphadenopathy:       Right: No inguinal adenopathy present.       Left: No inguinal adenopathy present.  Neurological: She is alert and oriented to person, place, and time. No cranial nerve deficit. She exhibits normal muscle tone.  Coordination normal.  Skin: Skin is warm and dry. She is not diaphoretic.  Psychiatric: She has a normal mood and affect. Her behavior is normal. Judgment and thought content normal.  Nursing note and vitals reviewed.   Results for orders placed or performed in visit on 04/02/14  POCT glycosylated hemoglobin (Hb A1C)  Result Value Ref Range   Hemoglobin A1C 5.6        Assessment & Plan:  HTN, goal below 140/80- Continue w/ current med, dietary changes and weight loss.   Encounter for cervical Pap smear with pelvic exam - Plan: Pap IG w/ reflex to HPV when ASC-U  Obesity (BMI 35.0-39.9 without comorbidity) - Encouraged exercise plan 2 days /week. Strength training advised to tone and build muscles. Plan: POCT glycosylated hemoglobin (Hb A1C), Hemoglobin A1c  Hyperglycemia - Plan: POCT glycosylated hemoglobin (Hb A1C), Hemoglobin A1c   Meds ordered this encounter  Medications  . hydrochlorothiazide (HYDRODIURIL) 25 MG tablet    Sig: Take 1/2 tablet every morning to lower blood pressure.    Dispense:  90 tablet    Refill:  3

## 2014-04-03 LAB — PAP IG W/ RFLX HPV ASCU

## 2014-04-08 LAB — HUMAN PAPILLOMAVIRUS, HIGH RISK: HPV DNA High Risk: DETECTED — AB

## 2014-04-10 ENCOUNTER — Encounter: Payer: Self-pay | Admitting: Family Medicine

## 2014-04-10 ENCOUNTER — Other Ambulatory Visit: Payer: Self-pay | Admitting: Family Medicine

## 2014-04-10 DIAGNOSIS — R87610 Atypical squamous cells of undetermined significance on cytologic smear of cervix (ASC-US): Secondary | ICD-10-CM

## 2014-04-10 DIAGNOSIS — R87810 Cervical high risk human papillomavirus (HPV) DNA test positive: Principal | ICD-10-CM

## 2014-04-11 ENCOUNTER — Telehealth: Payer: Self-pay | Admitting: Family Medicine

## 2014-04-11 NOTE — Telephone Encounter (Signed)
Phoned pt and explained PAP result and HPV detection to her. She understands and is agreeable to GYN referral. Advised pt to call back if she has not heard about an appt by Valentine's Day. Reassured her that this is not an emergency but indicated the importance of GYN evaluation.

## 2014-04-16 ENCOUNTER — Encounter: Payer: Self-pay | Admitting: Gynecology

## 2014-04-16 ENCOUNTER — Ambulatory Visit (INDEPENDENT_AMBULATORY_CARE_PROVIDER_SITE_OTHER): Payer: BLUE CROSS/BLUE SHIELD | Admitting: Gynecology

## 2014-04-16 VITALS — BP 140/80 | Ht 64.0 in | Wt 202.0 lb

## 2014-04-16 DIAGNOSIS — R896 Abnormal cytological findings in specimens from other organs, systems and tissues: Secondary | ICD-10-CM

## 2014-04-16 DIAGNOSIS — IMO0002 Reserved for concepts with insufficient information to code with codable children: Secondary | ICD-10-CM

## 2014-04-16 NOTE — Progress Notes (Signed)
   Patient is a 39 year old who was referred to our practice as a courtesy of Dr. 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 She is using condoms for contraception. An explanation of of colposcopic evaluation was given before procedure. The patient was then placed on the stirrups and colposcopic evaluation was undertaken whereby the external genitalia, perineum, and perirectal region were inspected. The speculum was then introduced into the vagina and a systematic inspection of the entire vaginal mucosa did not demonstrate any lesions or the vaginal cuff. An acetowhite flat area from 4 to 6:00 was noted at the ectocervix slightly into the transformation zone but the lesion was completely visualized with endocervical speculum. A biopsy from this area as well as an ECC was obtained and so overnight it was used for hemostasis.  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 For this reason patient with a detail colposcopic evaluation with biopsies as delineated above. Will notify the patient with the results as soon as they become available along with recommendations from the new [facility]P guidelines.

## 2014-04-16 NOTE — Addendum Note (Signed)
Addended by: Berna SpareASTILLO, Prudence Heiny A on: 04/16/2014 11:53 AM   Modules accepted: Orders

## 2014-04-16 NOTE — Patient Instructions (Signed)
Colposcopy Care After Colposcopy is a procedure in which a special tool is used to magnify the surface of the cervix. A tissue sample (biopsy) may also be taken. This sample will be looked at for cervical cancer or other problems. After the test:  You may have some cramping.  Lie down for a few minutes if you feel lightheaded.   You may have some bleeding which should stop in a few days. HOME CARE  Do not have sex or use tampons for 2 to 3 days or as told.  Only take medicine as told by your doctor.  Continue to take your birth control pills as usual. Finding out the results of your test Ask when your test results will be ready. Make sure you get your test results. GET HELP RIGHT AWAY IF:  You are bleeding a lot or are passing blood clots.  You develop a fever of 102 F (38.9 C) or higher.  You have abnormal vaginal discharge.  You have cramps that do not go away with medicine.  You feel lightheaded, dizzy, or pass out (faint). MAKE SURE YOU:   Understand these instructions.  Will watch your condition.  Will get help right away if you are not doing well or get worse. Document Released: 08/18/2007 Document Revised: 05/24/2011 Document Reviewed: 09/28/2012 ExitCare Patient Information 2015 [employer]. This information is not intended to replace advice given to you by your health care provider. Make sure you discuss any questions you have with your health care provider.  

## 2014-06-27 ENCOUNTER — Encounter: Payer: Self-pay | Admitting: Family Medicine

## 2014-06-27 ENCOUNTER — Telehealth: Payer: Self-pay

## 2014-06-27 NOTE — Telephone Encounter (Signed)
Good afternoon,          I have an appt scheduled for Tues 19th @ 2:45. I just realized that I will out of town all next week at a confetence for my job. May i please reschedule this visit.

## 2014-06-28 NOTE — Telephone Encounter (Signed)
lmom for patient to call back to set another appt up

## 2014-07-02 ENCOUNTER — Ambulatory Visit: Payer: BLUE CROSS/BLUE SHIELD | Admitting: Family Medicine

## 2015-05-20 ENCOUNTER — Other Ambulatory Visit: Payer: Self-pay

## 2015-05-20 MED ORDER — HYDROCHLOROTHIAZIDE 25 MG PO TABS: ORAL_TABLET | ORAL | Status: DC

## 2015-05-26 ENCOUNTER — Encounter: Payer: Self-pay | Admitting: Family Medicine

## 2015-05-26 ENCOUNTER — Other Ambulatory Visit: Payer: Self-pay | Admitting: Physician Assistant

## 2015-06-04 ENCOUNTER — Other Ambulatory Visit: Payer: Self-pay

## 2015-06-04 MED ORDER — HYDROCHLOROTHIAZIDE 25 MG PO TABS: ORAL_TABLET | ORAL | Status: DC

## 2015-07-24 ENCOUNTER — Encounter: Payer: BLUE CROSS/BLUE SHIELD | Admitting: Family Medicine

## 2015-08-07 ENCOUNTER — Encounter: Payer: BLUE CROSS/BLUE SHIELD | Admitting: Family Medicine

## 2015-08-15 ENCOUNTER — Other Ambulatory Visit: Payer: Self-pay | Admitting: Internal Medicine

## 2015-08-15 DIAGNOSIS — N644 Mastodynia: Secondary | ICD-10-CM

## 2015-08-21 ENCOUNTER — Ambulatory Visit
Admission: RE | Admit: 2015-08-21 | Discharge: 2015-08-21 | Disposition: A | Discharge status: Home / Self Care | Payer: BLUE CROSS/BLUE SHIELD | Source: Ambulatory Visit | Attending: Internal Medicine | Admitting: Internal Medicine

## 2015-08-21 DIAGNOSIS — N644 Mastodynia: Secondary | ICD-10-CM

## 2016-07-28 ENCOUNTER — Encounter: Payer: Self-pay | Admitting: Gynecology

## 2017-03-22 ENCOUNTER — Other Ambulatory Visit: Payer: Self-pay | Admitting: Internal Medicine

## 2017-03-22 DIAGNOSIS — R102 Pelvic and perineal pain: Secondary | ICD-10-CM

## 2017-04-04 ENCOUNTER — Ambulatory Visit
Admission: RE | Admit: 2017-04-04 | Discharge: 2017-04-04 | Disposition: A | Discharge status: Home / Self Care | Payer: BLUE CROSS/BLUE SHIELD | Source: Ambulatory Visit | Attending: Internal Medicine | Admitting: Internal Medicine

## 2017-04-04 DIAGNOSIS — R102 Pelvic and perineal pain: Secondary | ICD-10-CM

## 2018-04-11 IMAGING — MG 2D DIGITAL DIAGNOSTIC BILATERAL MAMMOGRAM WITH CAD AND ADJUNCT T
8 of 12 series · 8 of 28 positions shown · non-contrast
Comparison: 01/21/2012

CLINICAL DATA: 40-year-old patient with intermittent bilateral
breast pain. She does not palpate any lumps.

EXAM:
2D DIGITAL DIAGNOSTIC BILATERAL MAMMOGRAM WITH CAD AND ADJUNCT TOMO

[R MLO synth-2D]
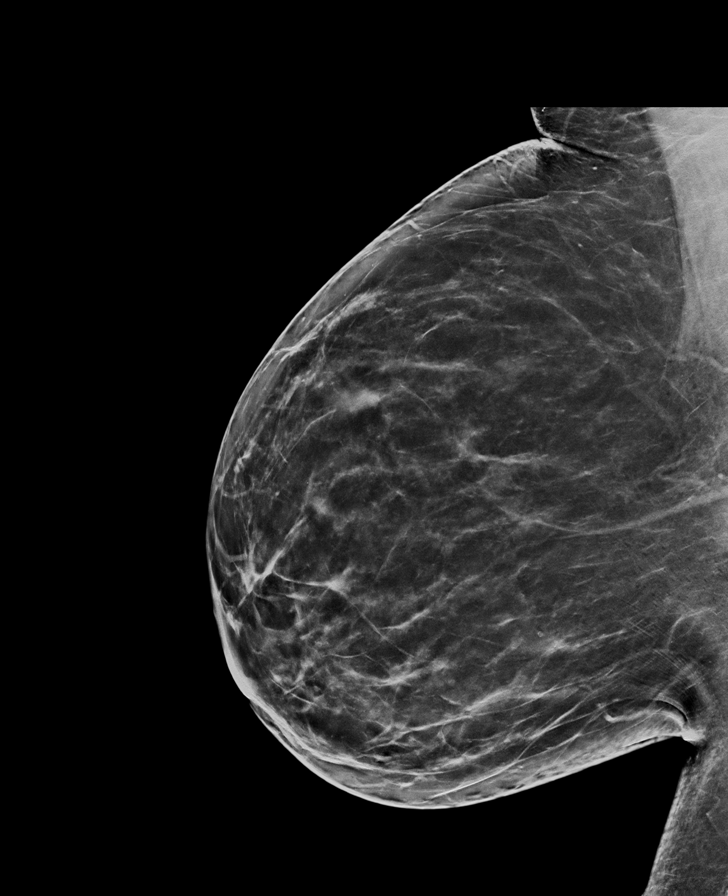

[R MLO]
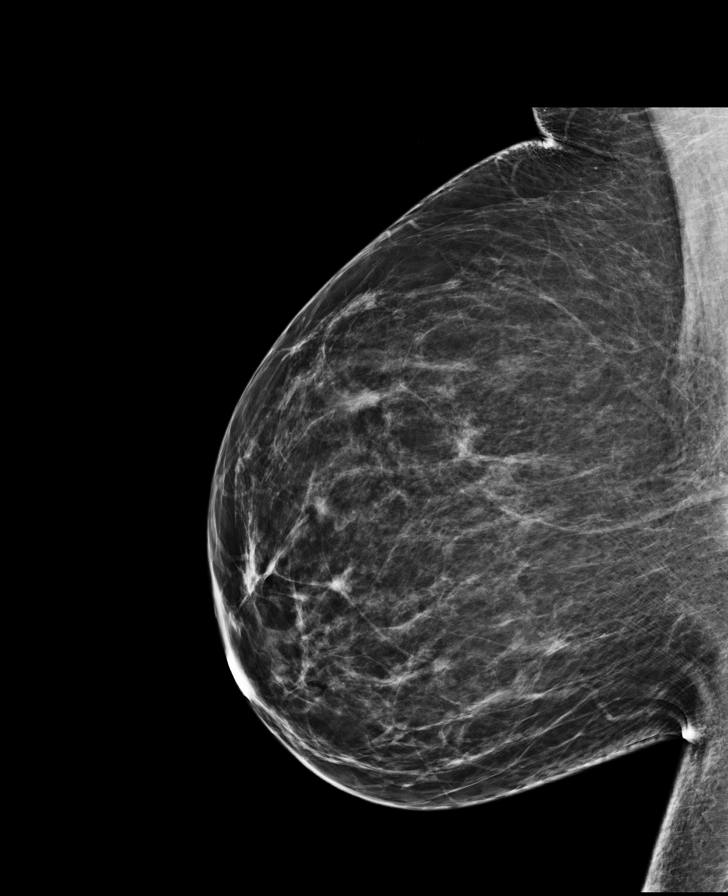

[L CC]
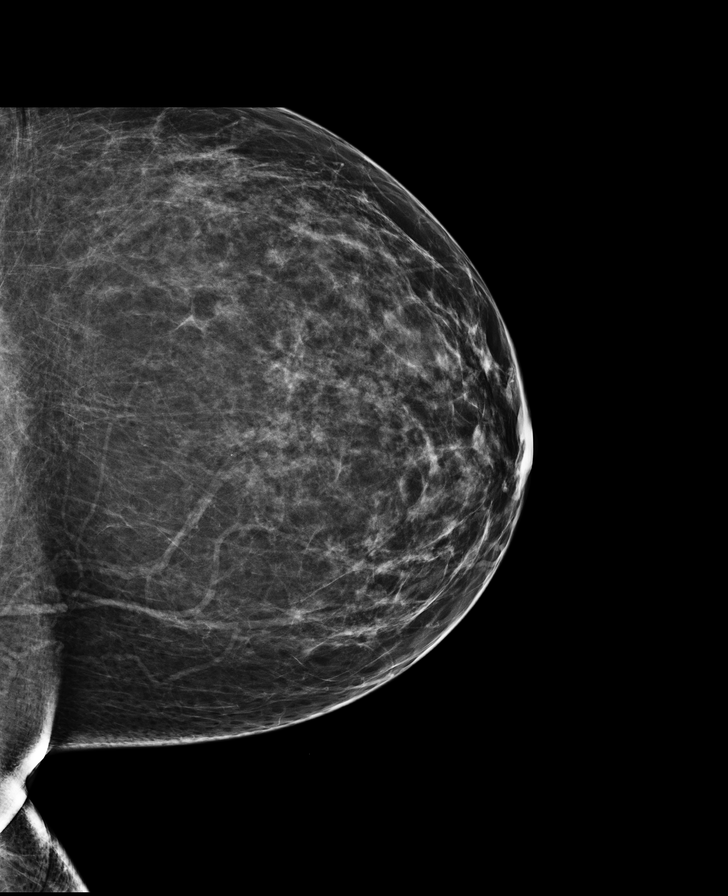

[L CC synth-2D]
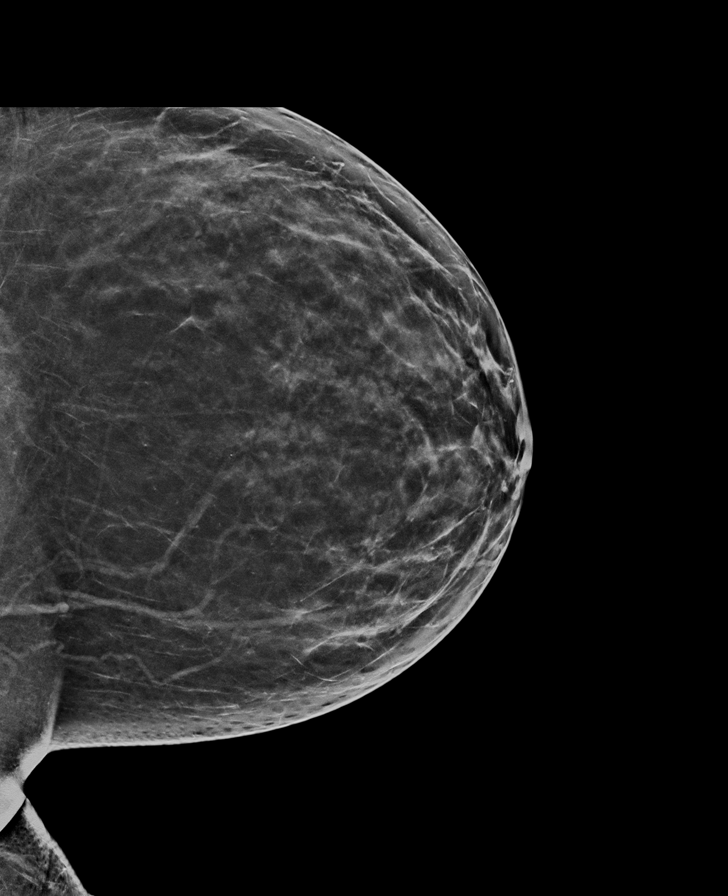

[L MLO synth-2D]
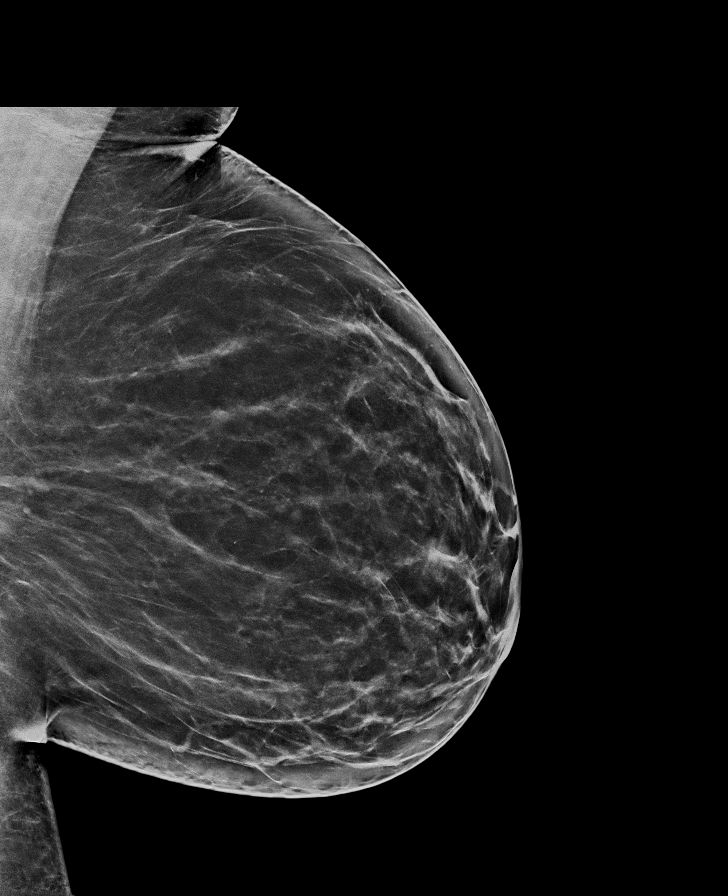

[R CC]
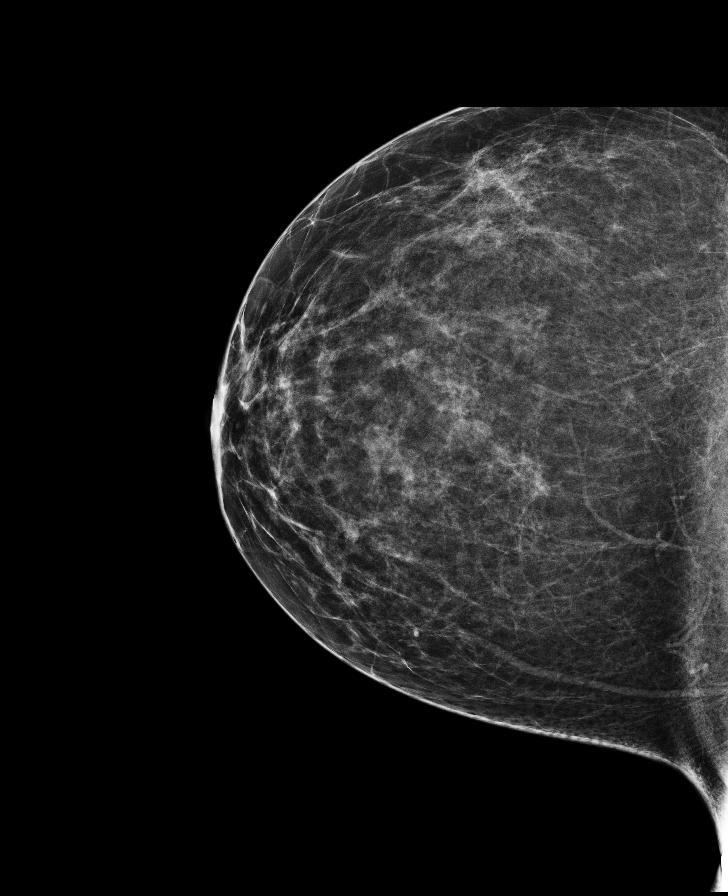

[L MLO]
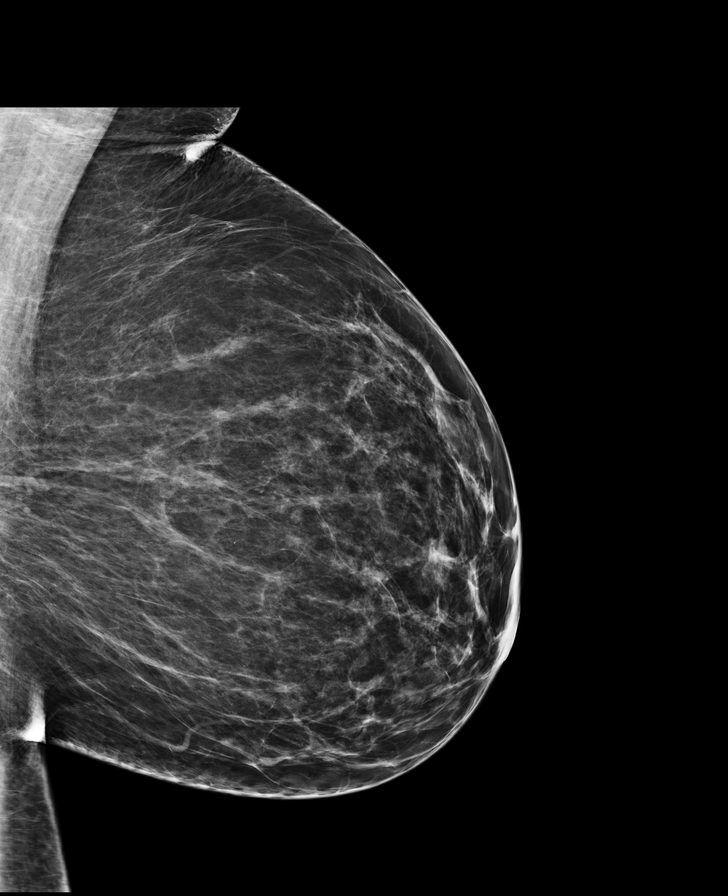

[R CC synth-2D]
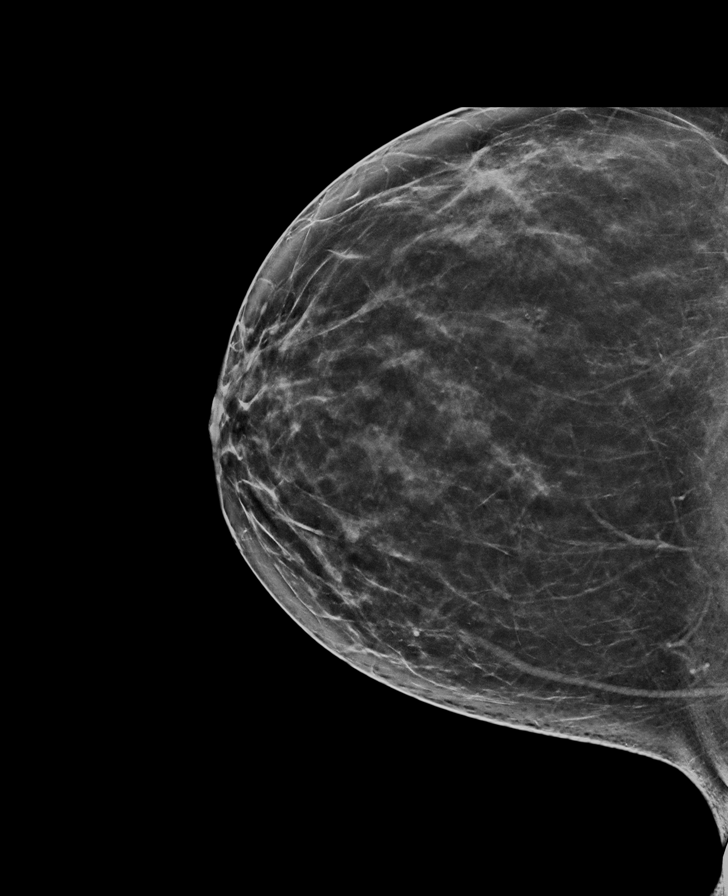

[8 of 28 positions shown; findings below may reference images not displayed]

ACR Breast Density Category b: There are scattered areas of
fibroglandular density.
FINDINGS: No mass, distortion, or suspicious microcalcification is identified
in either breast to suggest malignancy.

Mammographic images were processed with CAD.
IMPRESSION: No evidence of malignancy in either breast.

RECOMMENDATION:
Screening mammogram in one year.(Code:TI-X-DNT)

I have discussed the findings and recommendations with the patient.
Results were also provided in writing at the conclusion of the
visit. If applicable, a reminder letter will be sent to the patient
regarding the next appointment.

BI-RADS CATEGORY  1: Negative.

## 2018-04-20 LAB — HM PAP SMEAR: HPV 16/18/45 genotyping: NEGATIVE

## 2018-05-05 ENCOUNTER — Other Ambulatory Visit: Payer: Self-pay | Admitting: Obstetrics and Gynecology

## 2018-05-05 DIAGNOSIS — R928 Other abnormal and inconclusive findings on diagnostic imaging of breast: Secondary | ICD-10-CM

## 2018-05-18 ENCOUNTER — Ambulatory Visit
Admission: RE | Admit: 2018-05-18 | Discharge: 2018-05-18 | Disposition: A | Discharge status: Home / Self Care | Payer: BLUE CROSS/BLUE SHIELD | Source: Ambulatory Visit | Attending: Obstetrics and Gynecology | Admitting: Obstetrics and Gynecology

## 2018-05-18 ENCOUNTER — Ambulatory Visit
Admission: RE | Admit: 2018-05-18 | Discharge: 2018-05-18 | Disposition: A | Discharge status: Home / Self Care | Payer: BC Managed Care – PPO | Source: Ambulatory Visit | Attending: Obstetrics and Gynecology | Admitting: Obstetrics and Gynecology

## 2018-05-18 DIAGNOSIS — R928 Other abnormal and inconclusive findings on diagnostic imaging of breast: Secondary | ICD-10-CM

## 2018-12-10 IMAGING — US US PELVIS COMPLETE TRANSABD/TRANSVAG
1 series · 13 of 25 positions shown · non-contrast
Comparison: None

CLINICAL DATA: Initial evaluation for acute pelvic pain,
menorrhagia.

EXAM:
TRANSABDOMINAL AND TRANSVAGINAL ULTRASOUND OF PELVIS
TECHNIQUE: Both transabdominal and transvaginal ultrasound examinations of the
pelvis were performed. Transabdominal technique was performed for
global imaging of the pelvis including uterus, ovaries, adnexal
regions, and pelvic cul-de-sac. It was necessary to proceed with
endovaginal exam following the transabdominal exam to visualize the
uterus and ovaries.

[Series 1: us pelvis complete transabd/transvag · 0.19mm/px · 13 of 98 slices shown]
[im 1/98]
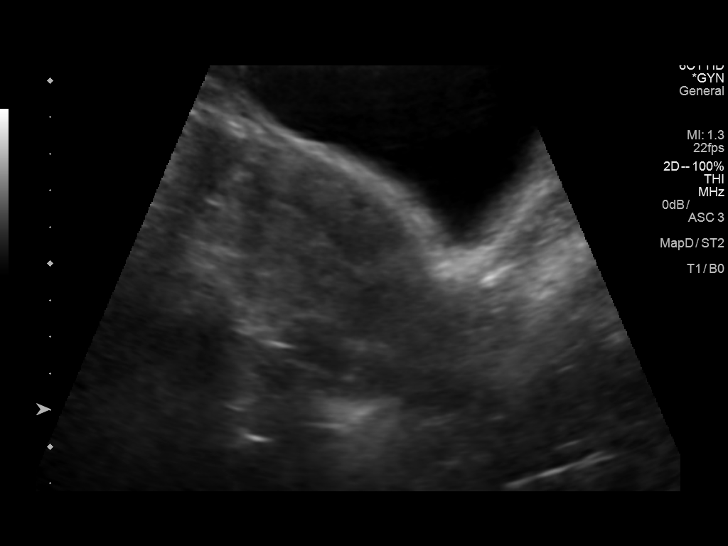
[im 9/98]
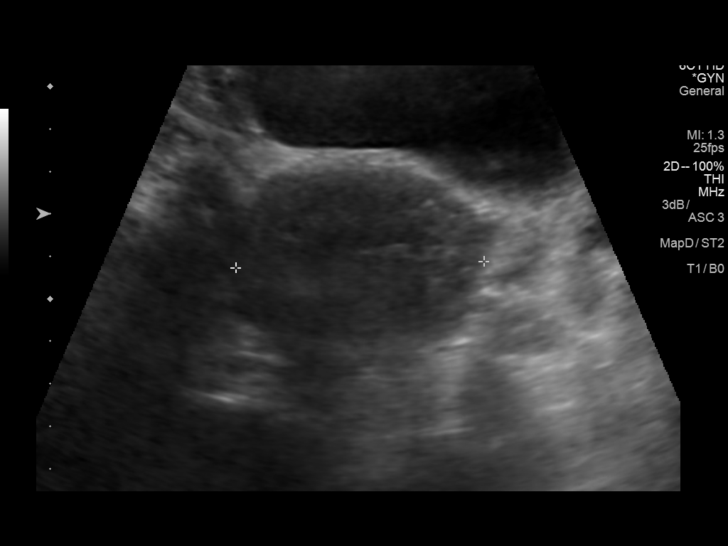
[im 17/98]
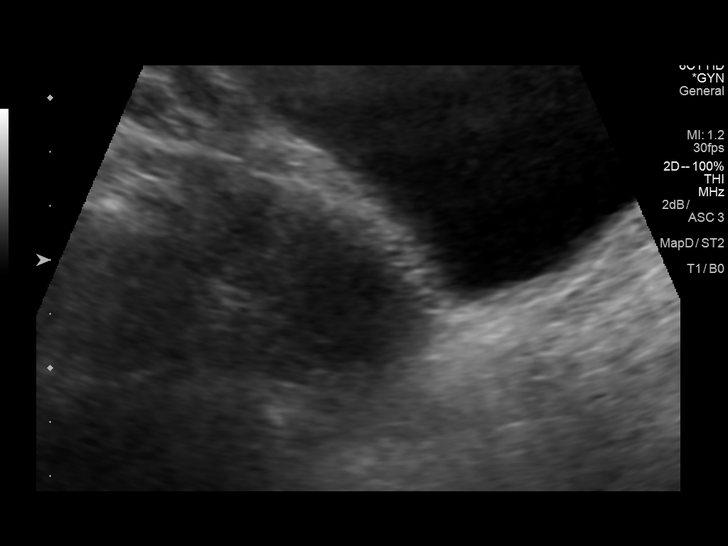
[im 25/98]
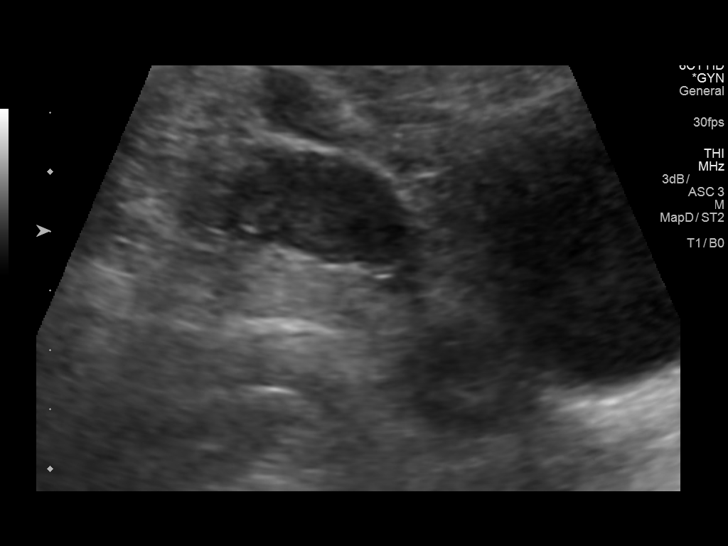
[im 33/98]
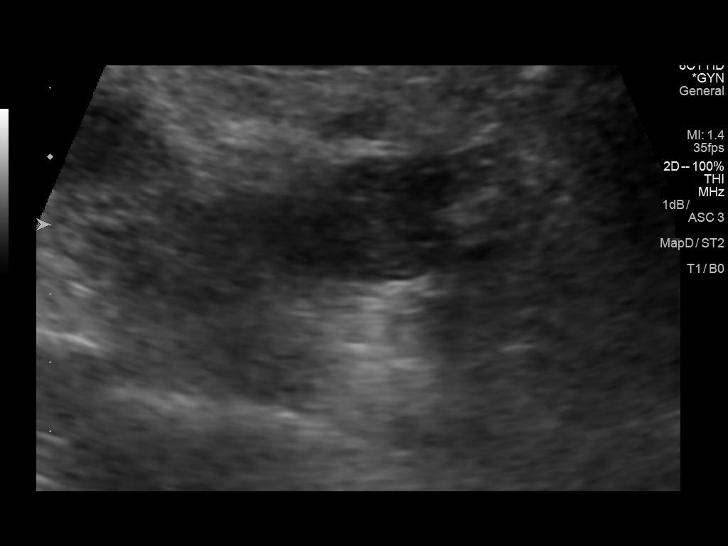
[im 41/98]
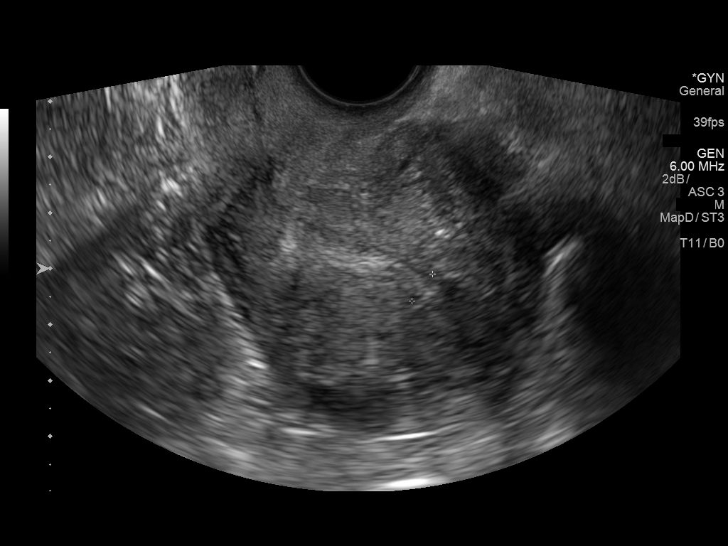
[im 49/98]
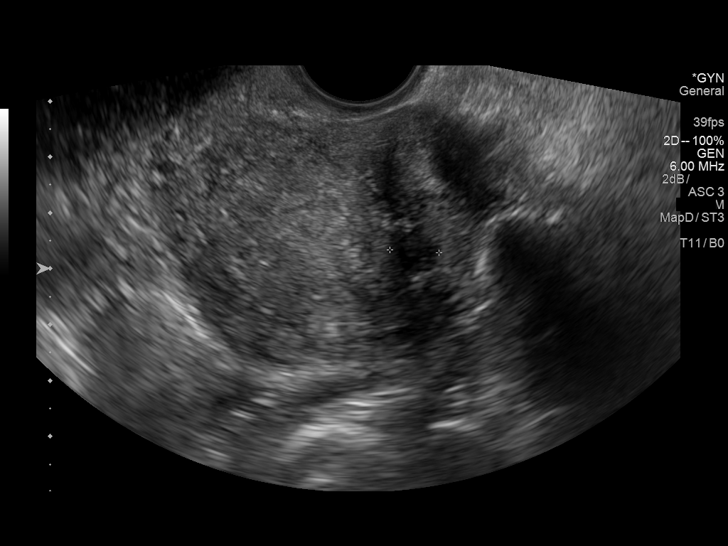
[im 57/98]
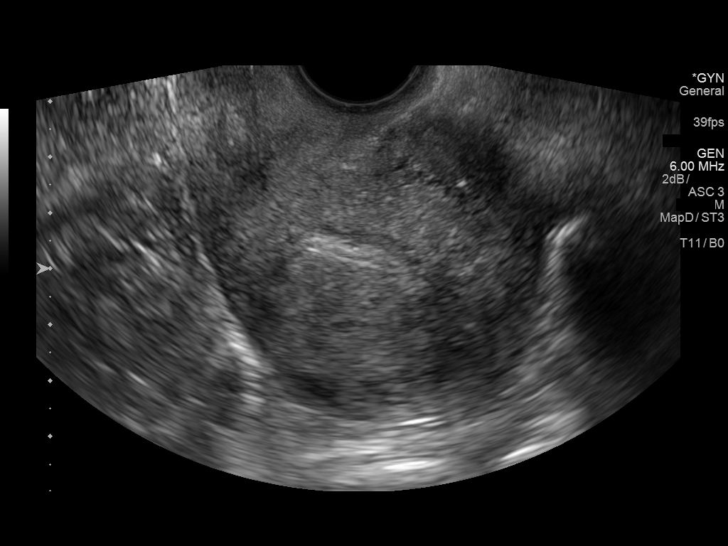
[im 65/98]
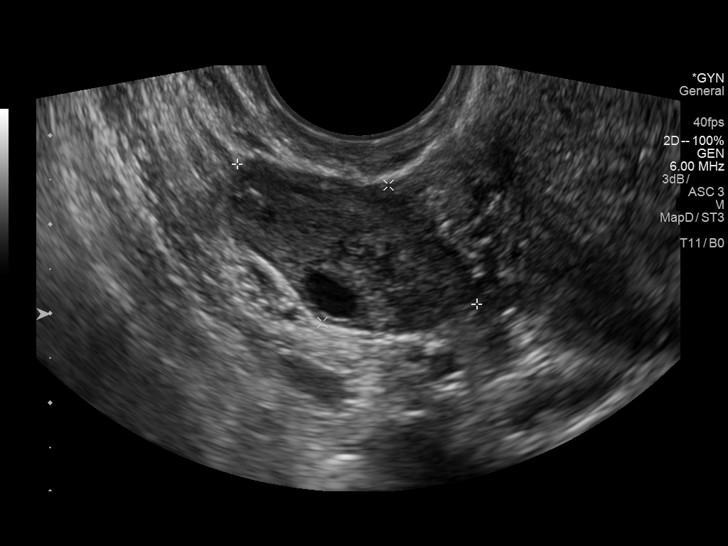
[im 73/98]
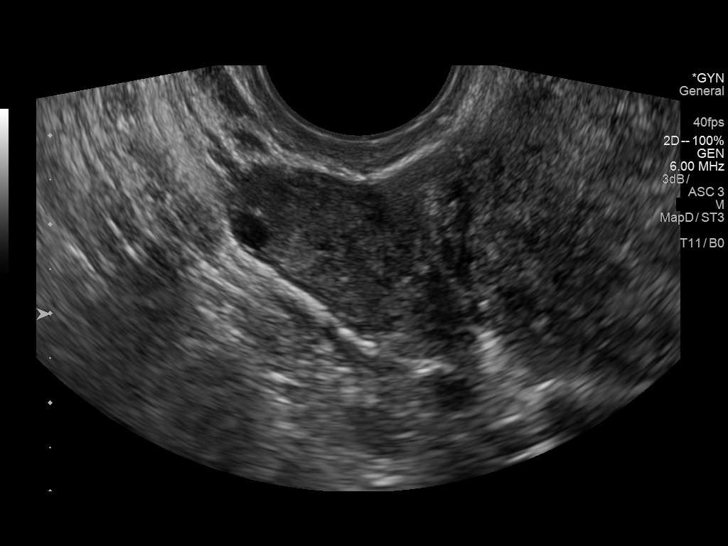
[im 81/98]
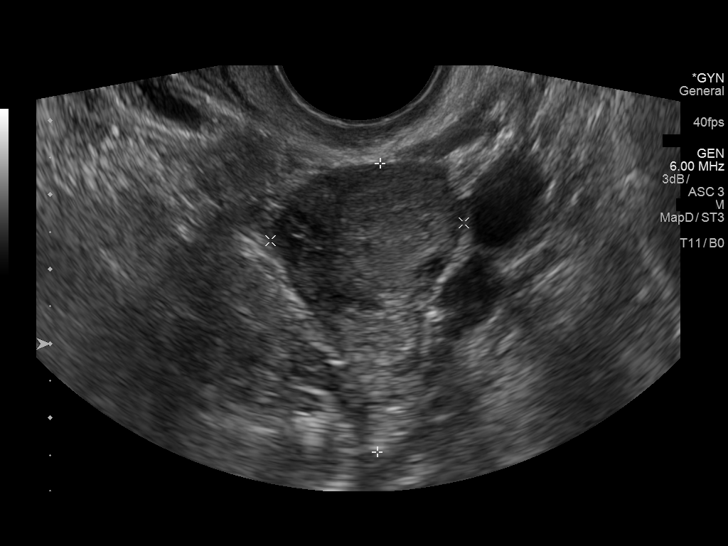
[im 89/98]
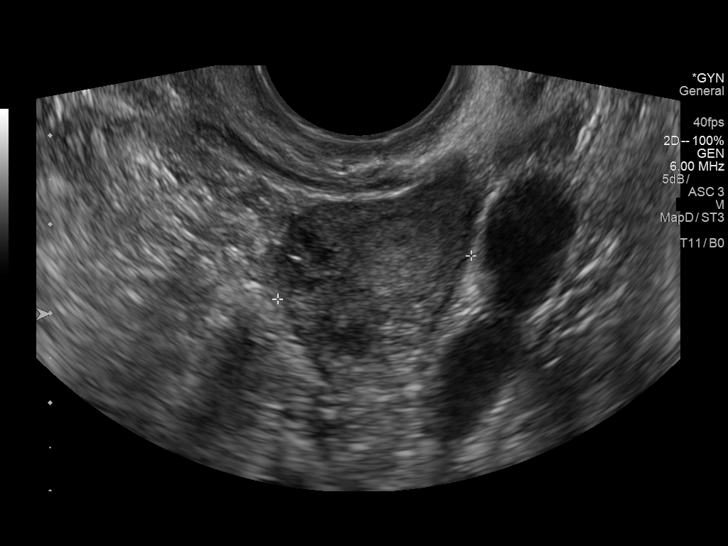
[im 98/98]
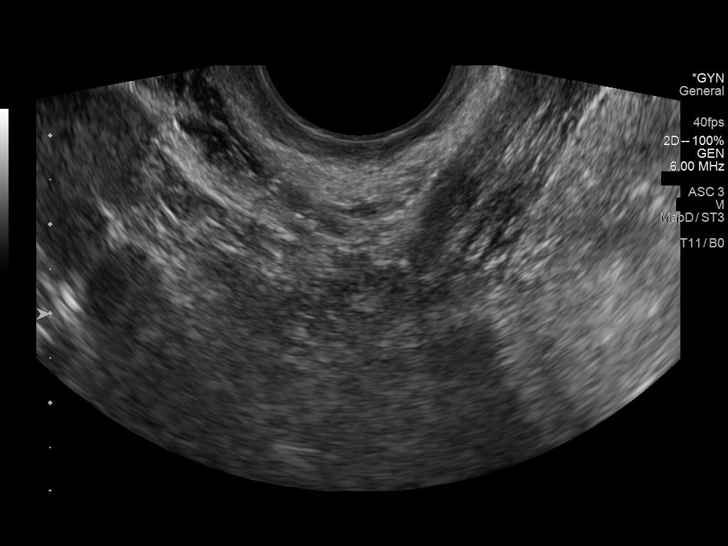

[13 of 25 positions shown; findings below may reference images not displayed]

FINDINGS: Uterus

Measurements: 8.0 x 5.2 x 6.3 cm. 2.0 x 1.3 x 1.9 cm intramural
fibroid present at the anterior uterine fundus. 1.5 x 1.0 x 1.7 cm
intramural fibroid present at the anterior mid uterine body. 0.9 x
1.0 x 0.9 cm intramural/sub mucosal fibroid present at the posterior
mid uterine body.

Endometrium

Thickness: 6.1 mm.  No focal abnormality visualized.

Right ovary

Measurements: 3.1 x 1.7 x 1.7 cm. Normal appearance/no adnexal mass.

Left ovary

Measurements: 3.8 x 2.6 x 2.2 cm. Normal appearance/no adnexal mass.

Other findings

No abnormal free fluid.
IMPRESSION: 1. Fibroid uterus as above.
2. Otherwise unremarkable and normal pelvic ultrasound.

## 2020-11-16 DIAGNOSIS — D259 Leiomyoma of uterus, unspecified: Secondary | ICD-10-CM | POA: Insufficient documentation

## 2020-11-19 ENCOUNTER — Other Ambulatory Visit: Payer: Self-pay | Admitting: Obstetrics and Gynecology

## 2020-11-19 DIAGNOSIS — R928 Other abnormal and inconclusive findings on diagnostic imaging of breast: Secondary | ICD-10-CM

## 2020-12-05 DIAGNOSIS — J302 Other seasonal allergic rhinitis: Secondary | ICD-10-CM | POA: Insufficient documentation

## 2020-12-09 ENCOUNTER — Ambulatory Visit
Admission: RE | Admit: 2020-12-09 | Discharge: 2020-12-09 | Disposition: A | Discharge status: Home / Self Care | Payer: BC Managed Care – PPO | Source: Ambulatory Visit | Attending: Obstetrics and Gynecology | Admitting: Obstetrics and Gynecology

## 2020-12-09 ENCOUNTER — Ambulatory Visit: Payer: BLUE CROSS/BLUE SHIELD

## 2020-12-09 ENCOUNTER — Other Ambulatory Visit: Payer: Self-pay

## 2020-12-09 DIAGNOSIS — R928 Other abnormal and inconclusive findings on diagnostic imaging of breast: Secondary | ICD-10-CM

## 2021-01-06 IMAGING — US ULTRASOUND RIGHT BREAST LIMITED
1 series · 9 of 9 positions shown · non-contrast
Comparison: Previous exam(s).

CLINICAL DATA: Patient presents for additional views of the right
breast as follow-up to recent screening exam to evaluate a possible
asymmetry.

EXAM:
DIGITAL DIAGNOSTIC right MAMMOGRAM WITH TOMO
ULTRASOUND right BREAST

[Series 1: ultrasound right breast limited · 0.07mm/px · 9 of 9 slices shown]
[im 1/9]
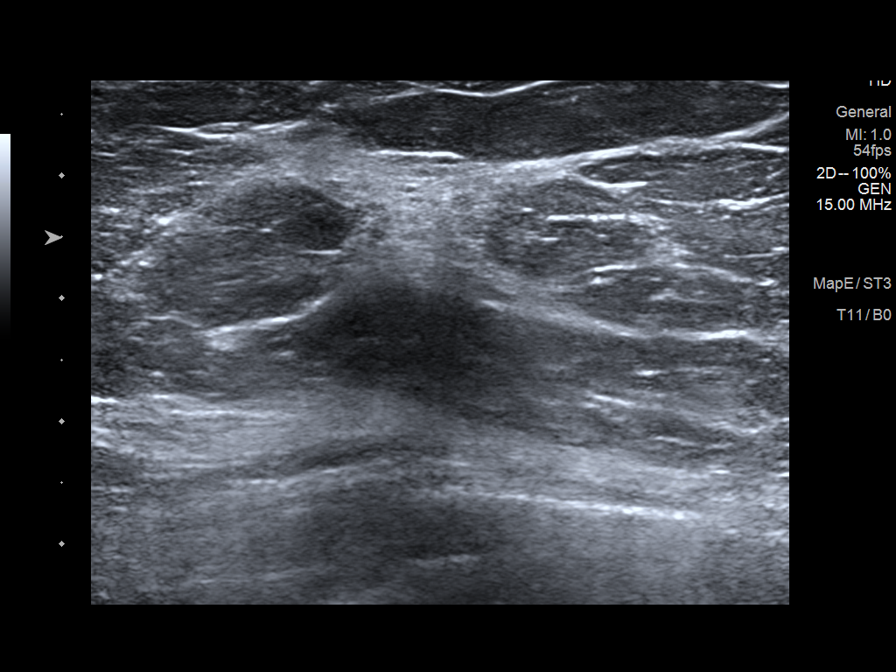
[im 2/9]
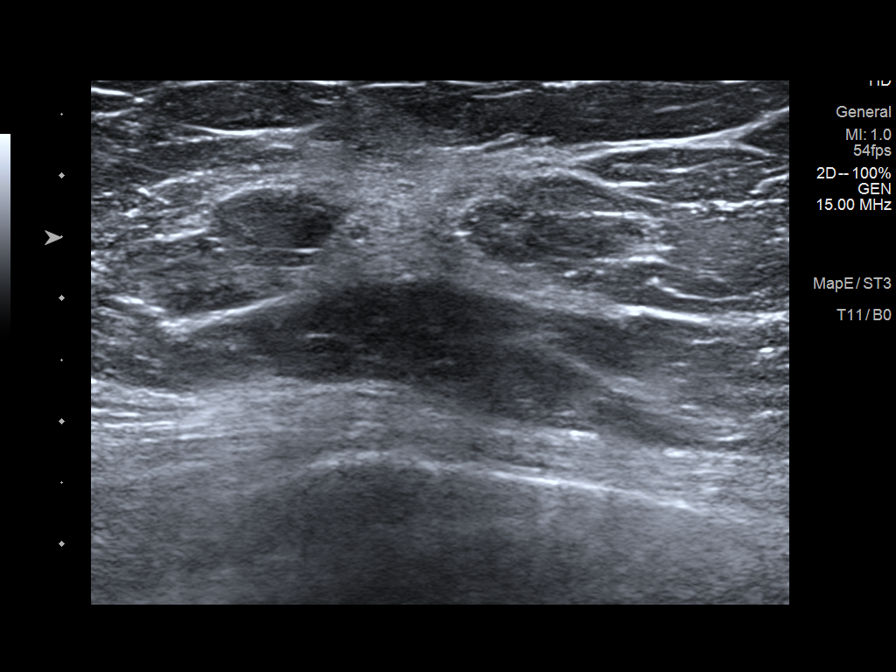
[im 3/9]
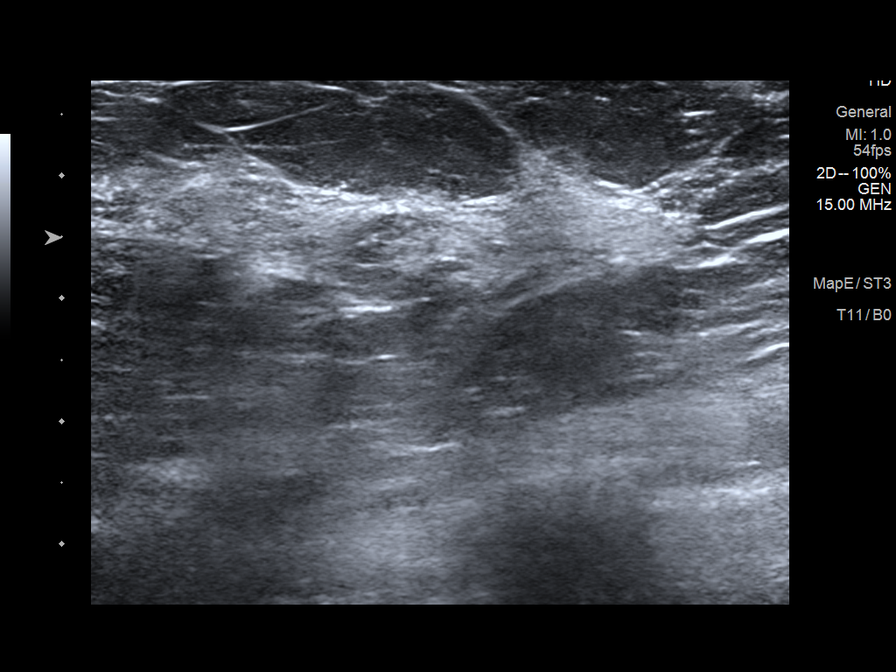
[im 4/9]
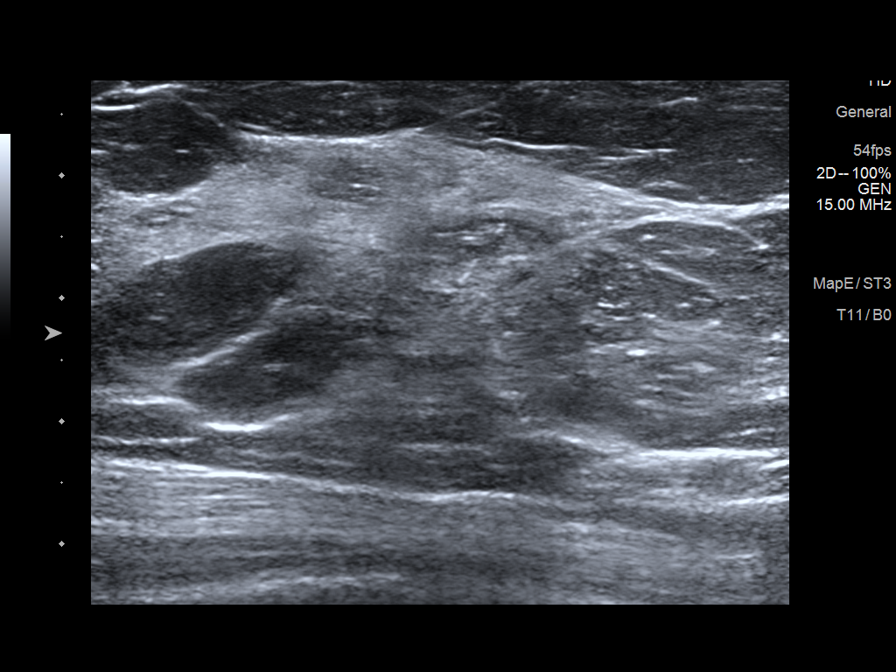
[im 5/9]
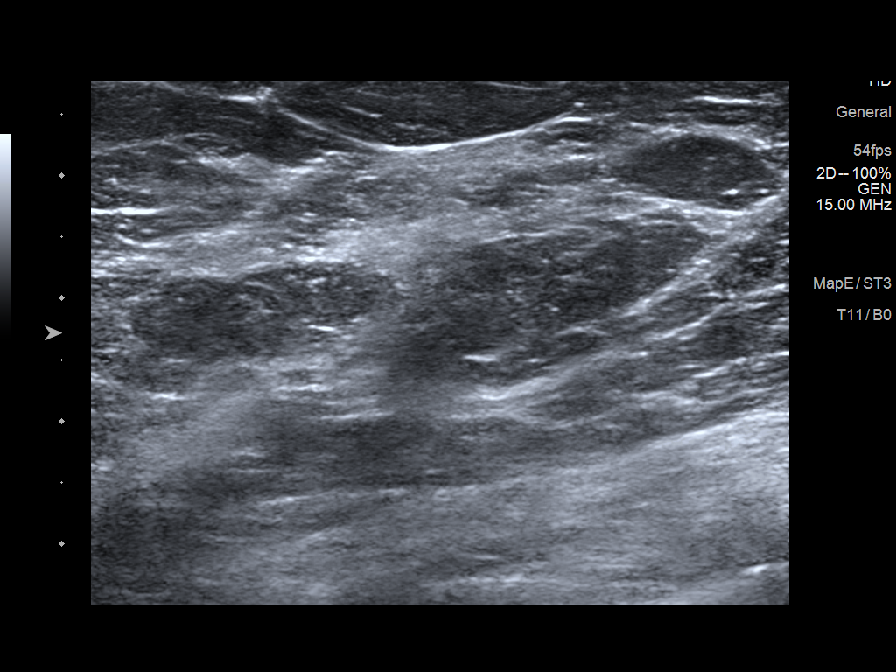
[im 6/9]
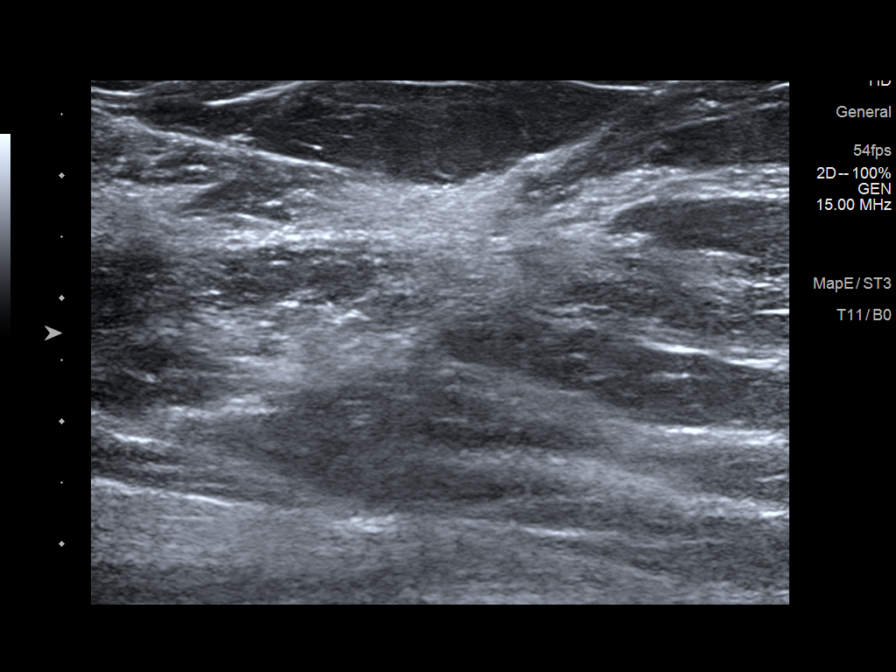
[im 7/9]
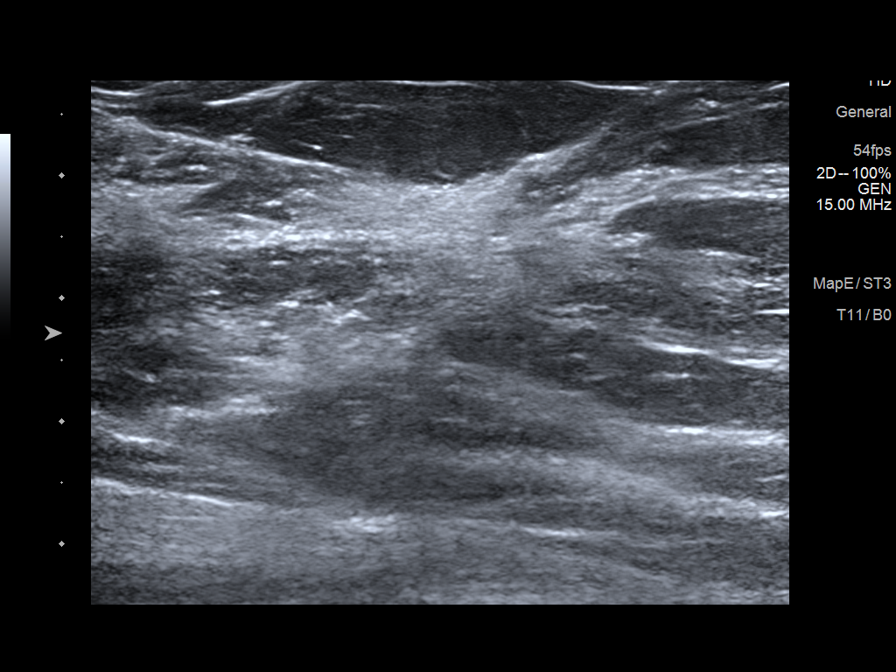
[im 8/9]
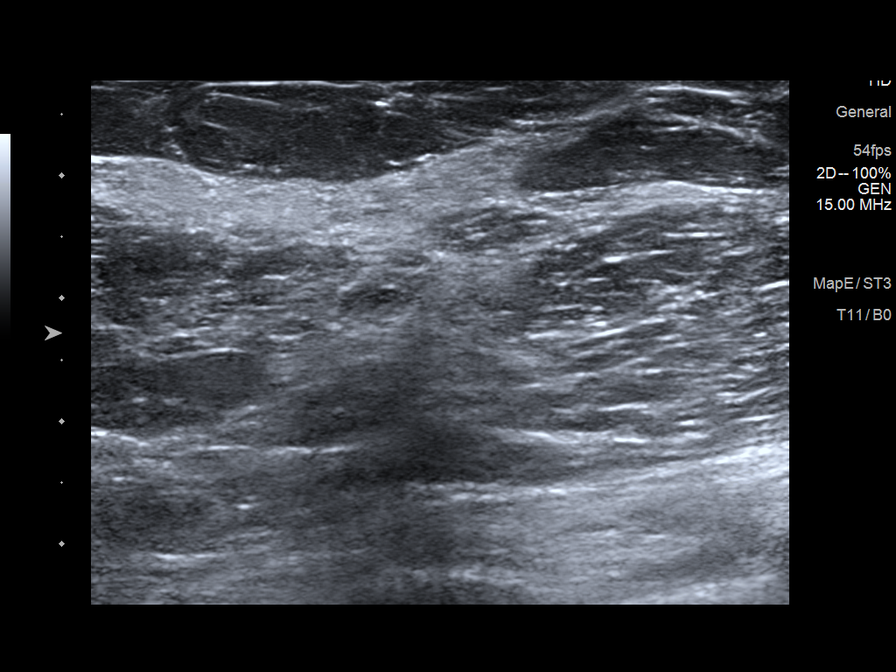
[im 9/9]
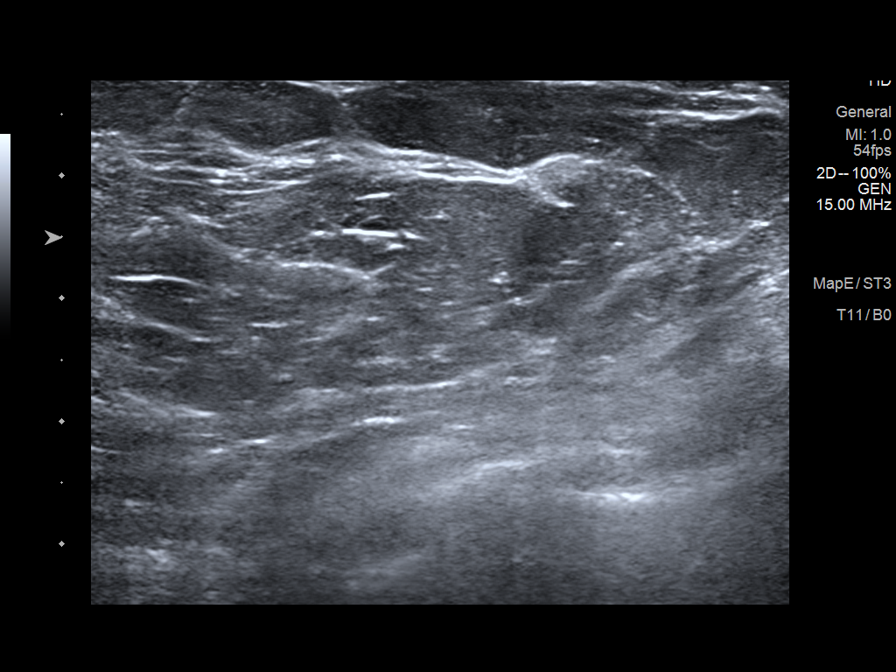

[9 of 9 positions shown; findings below may reference images not displayed]

ACR Breast Density Category b: There are scattered areas of
fibroglandular density.
FINDINGS: Additional spot compression views demonstrate asymmetric
fibroglandular tissue over the outer midportion of the right breast
without focal abnormality.

Targeted ultrasound is performed, showing no focal abnormality over
the outer midportion of the right breast as there is a band of dense
fibroglandular tissue correlating to the mammographic findings.
IMPRESSION: No focal abnormality over the outer midportion of the right breast.

RECOMMENDATION:
Recommend continued annual bilateral screening mammographic
follow-up.

I have discussed the findings and recommendations with the patient.
Results were also provided in writing at the conclusion of the
visit. If applicable, a reminder letter will be sent to the patient
regarding the next appointment.

BI-RADS CATEGORY  1: Negative.

## 2021-01-06 IMAGING — MG DIGITAL DIAGNOSTIC UNILATERAL RIGHT MAMMOGRAM WITH TOMO AND CAD
6 of 10 series · 6 of 30 positions shown · non-contrast
Comparison: Previous exam(s).

CLINICAL DATA: Patient presents for additional views of the right
breast as follow-up to recent screening exam to evaluate a possible
asymmetry.

EXAM:
DIGITAL DIAGNOSTIC right MAMMOGRAM WITH TOMO
ULTRASOUND right BREAST

[R MLO synth-2D (1 of 3)]
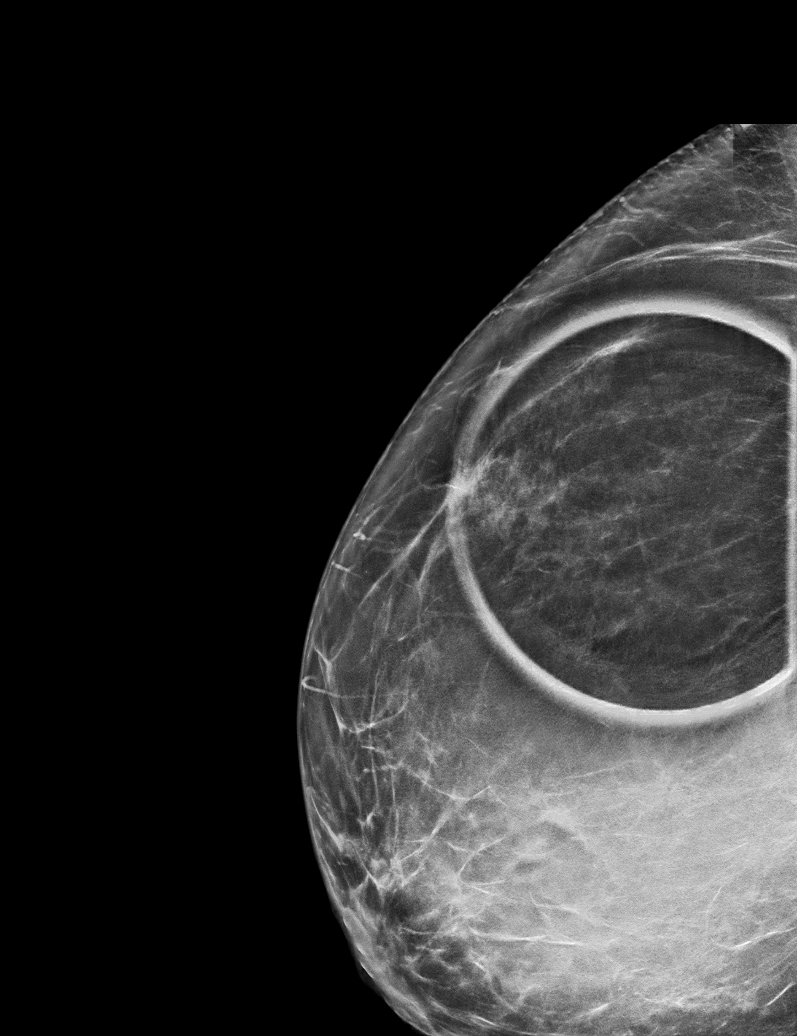

[R MLO synth-2D (2 of 3)]
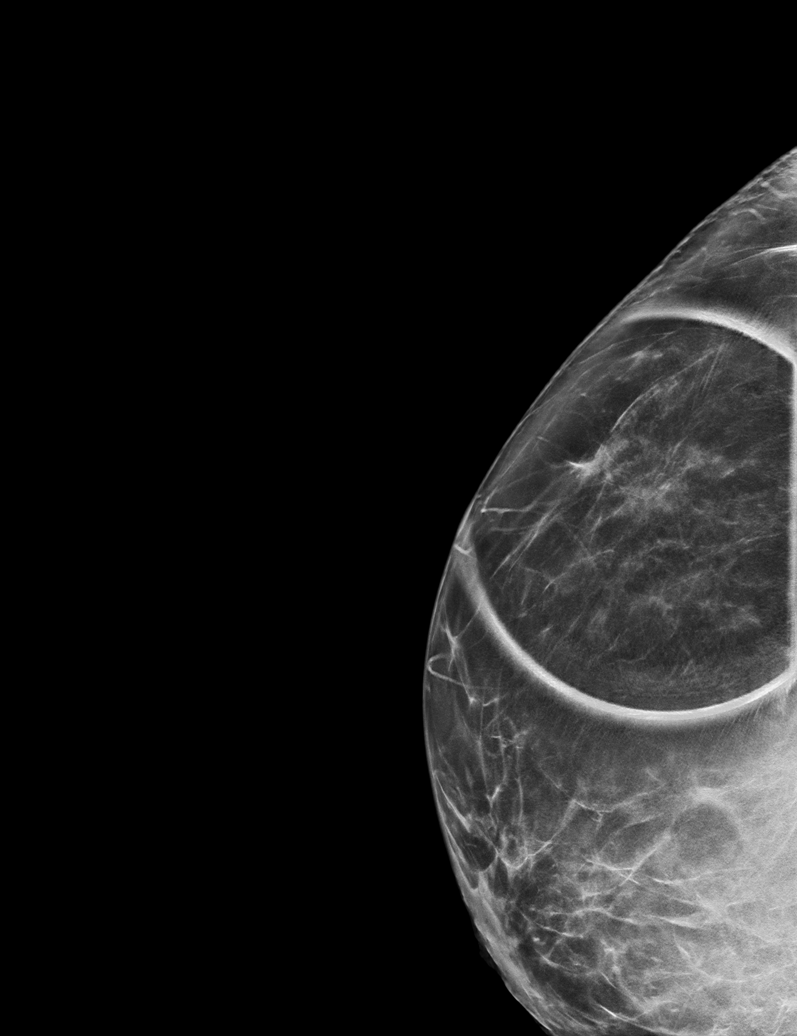

[R MLO synth-2D (3 of 3)]
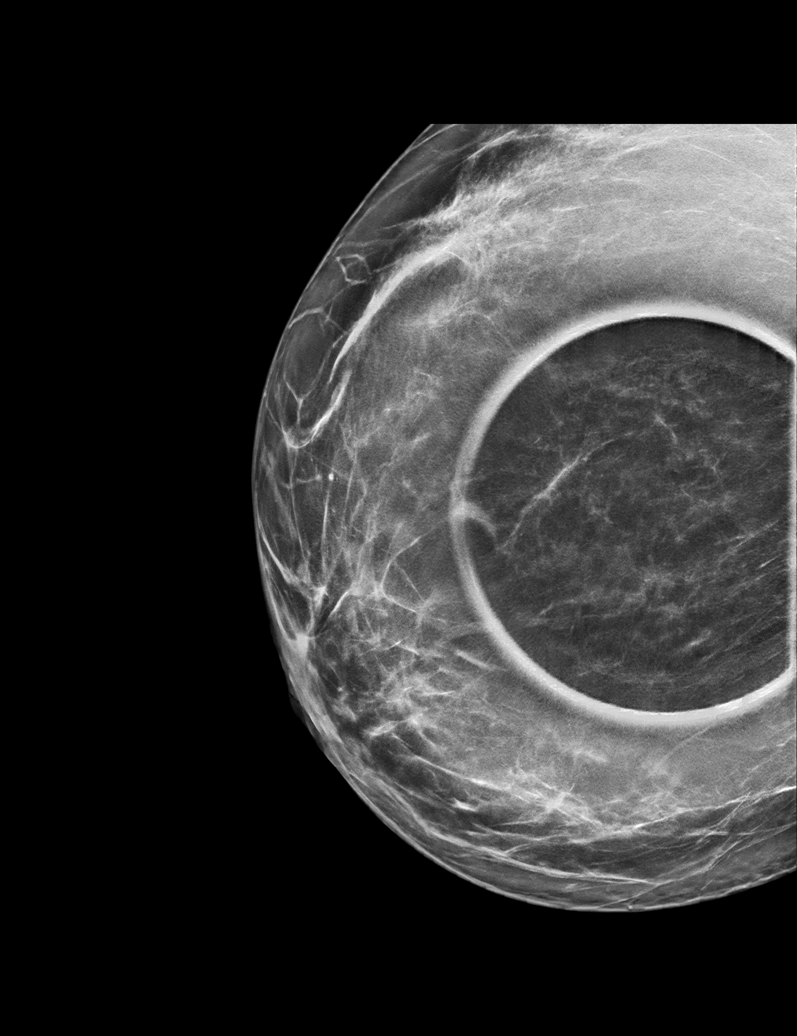

[R CC synth-2D (1 of 2)]
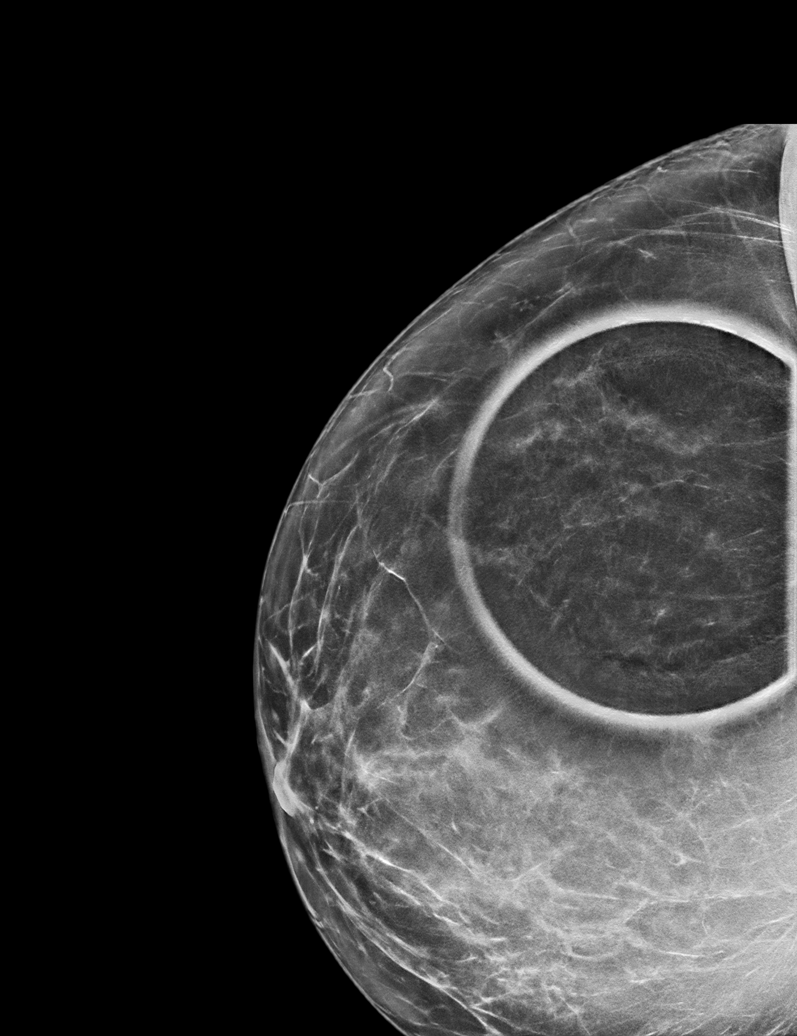

[R CC synth-2D (2 of 2)]
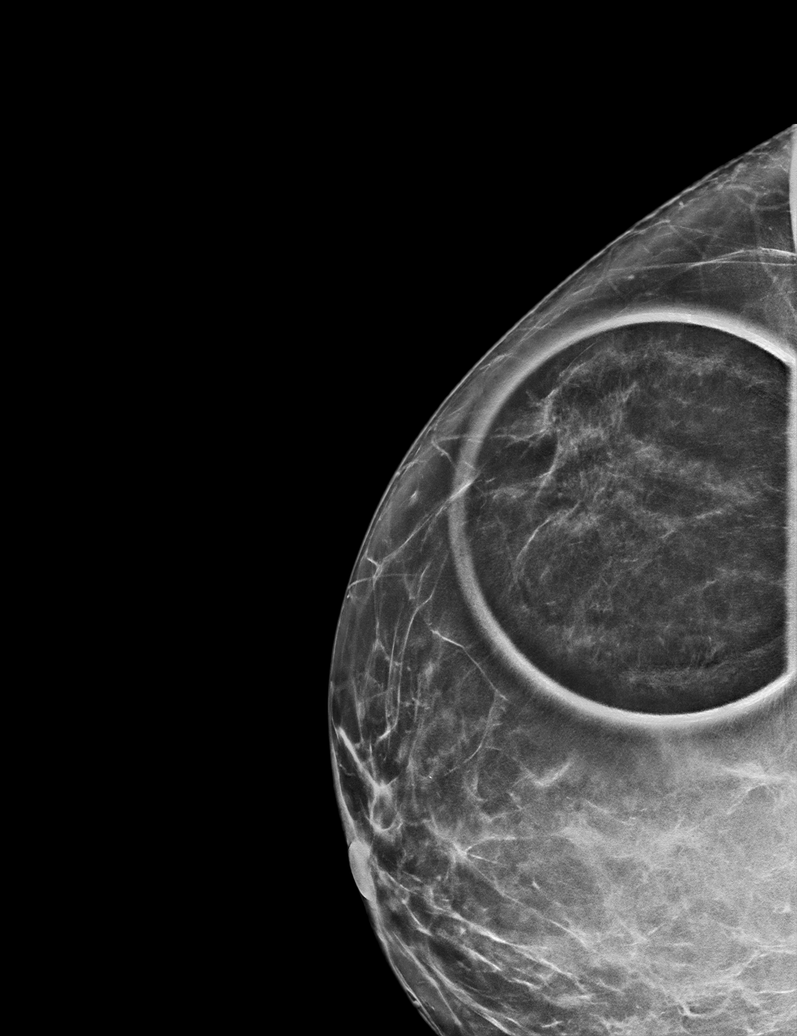

[R CC tomo · tomo slice 39/77.0]
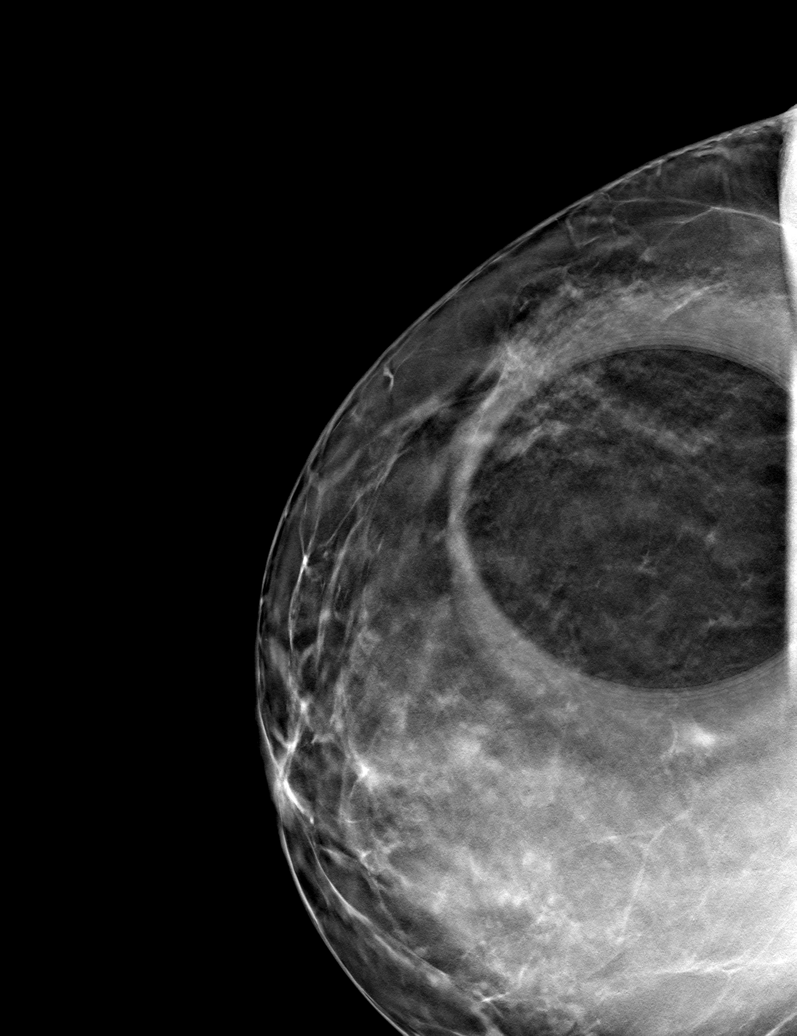

[6 of 30 positions shown; findings below may reference images not displayed]

ACR Breast Density Category b: There are scattered areas of
fibroglandular density.
FINDINGS: Additional spot compression views demonstrate asymmetric
fibroglandular tissue over the outer midportion of the right breast
without focal abnormality.

Targeted ultrasound is performed, showing no focal abnormality over
the outer midportion of the right breast as there is a band of dense
fibroglandular tissue correlating to the mammographic findings.
IMPRESSION: No focal abnormality over the outer midportion of the right breast.

RECOMMENDATION:
Recommend continued annual bilateral screening mammographic
follow-up.

I have discussed the findings and recommendations with the patient.
Results were also provided in writing at the conclusion of the
visit. If applicable, a reminder letter will be sent to the patient
regarding the next appointment.

BI-RADS CATEGORY  1: Negative.

## 2021-04-17 DIAGNOSIS — I119 Hypertensive heart disease without heart failure: Secondary | ICD-10-CM | POA: Insufficient documentation

## 2021-04-17 DIAGNOSIS — I10 Essential (primary) hypertension: Secondary | ICD-10-CM | POA: Insufficient documentation

## 2021-04-17 DIAGNOSIS — F4320 Adjustment disorder, unspecified: Secondary | ICD-10-CM | POA: Insufficient documentation

## 2021-04-17 DIAGNOSIS — E669 Obesity, unspecified: Secondary | ICD-10-CM | POA: Insufficient documentation

## 2022-04-01 DIAGNOSIS — Z789 Other specified health status: Secondary | ICD-10-CM | POA: Diagnosis not present

## 2022-04-01 DIAGNOSIS — H66002 Acute suppurative otitis media without spontaneous rupture of ear drum, left ear: Secondary | ICD-10-CM | POA: Diagnosis not present

## 2022-04-01 DIAGNOSIS — J309 Allergic rhinitis, unspecified: Secondary | ICD-10-CM | POA: Diagnosis not present

## 2022-04-01 DIAGNOSIS — I10 Essential (primary) hypertension: Secondary | ICD-10-CM | POA: Diagnosis not present

## 2022-04-01 DIAGNOSIS — Z6833 Body mass index (BMI) 33.0-33.9, adult: Secondary | ICD-10-CM | POA: Diagnosis not present

## 2022-04-13 DIAGNOSIS — R051 Acute cough: Secondary | ICD-10-CM | POA: Diagnosis not present

## 2022-04-13 DIAGNOSIS — J0110 Acute frontal sinusitis, unspecified: Secondary | ICD-10-CM | POA: Diagnosis not present

## 2022-04-13 DIAGNOSIS — Z6835 Body mass index (BMI) 35.0-35.9, adult: Secondary | ICD-10-CM | POA: Diagnosis not present

## 2022-04-13 DIAGNOSIS — Z23 Encounter for immunization: Secondary | ICD-10-CM | POA: Diagnosis not present

## 2022-04-13 DIAGNOSIS — I10 Essential (primary) hypertension: Secondary | ICD-10-CM | POA: Diagnosis not present

## 2022-04-20 DIAGNOSIS — R350 Frequency of micturition: Secondary | ICD-10-CM | POA: Diagnosis not present

## 2022-04-20 DIAGNOSIS — R69 Illness, unspecified: Secondary | ICD-10-CM | POA: Diagnosis not present

## 2022-04-26 DIAGNOSIS — N945 Secondary dysmenorrhea: Secondary | ICD-10-CM | POA: Diagnosis not present

## 2022-04-26 DIAGNOSIS — Z1231 Encounter for screening mammogram for malignant neoplasm of breast: Secondary | ICD-10-CM | POA: Diagnosis not present

## 2022-04-26 DIAGNOSIS — Z130 Encounter for screening for diseases of the blood and blood-forming organs and certain disorders involving the immune mechanism: Secondary | ICD-10-CM | POA: Diagnosis not present

## 2022-04-26 DIAGNOSIS — Z1211 Encounter for screening for malignant neoplasm of colon: Secondary | ICD-10-CM | POA: Diagnosis not present

## 2022-04-26 DIAGNOSIS — Z1389 Encounter for screening for other disorder: Secondary | ICD-10-CM | POA: Diagnosis not present

## 2022-04-26 DIAGNOSIS — N951 Menopausal and female climacteric states: Secondary | ICD-10-CM | POA: Diagnosis not present

## 2022-04-26 DIAGNOSIS — Z01411 Encounter for gynecological examination (general) (routine) with abnormal findings: Secondary | ICD-10-CM | POA: Diagnosis not present

## 2022-04-28 LAB — HM MAMMOGRAPHY

## 2022-05-04 DIAGNOSIS — Z1211 Encounter for screening for malignant neoplasm of colon: Secondary | ICD-10-CM | POA: Diagnosis not present

## 2022-05-04 DIAGNOSIS — I10 Essential (primary) hypertension: Secondary | ICD-10-CM | POA: Diagnosis not present

## 2022-05-04 DIAGNOSIS — D509 Iron deficiency anemia, unspecified: Secondary | ICD-10-CM | POA: Diagnosis not present

## 2022-05-06 DIAGNOSIS — I10 Essential (primary) hypertension: Secondary | ICD-10-CM | POA: Diagnosis not present

## 2022-05-06 DIAGNOSIS — D509 Iron deficiency anemia, unspecified: Secondary | ICD-10-CM | POA: Diagnosis not present

## 2022-05-08 LAB — LAB REPORT - SCANNED
A1c: 6.7
EGFR: 94

## 2022-05-10 ENCOUNTER — Ambulatory Visit: Payer: 59 | Admitting: Physician Assistant

## 2022-05-10 ENCOUNTER — Encounter: Payer: Self-pay | Admitting: Physician Assistant

## 2022-05-10 VITALS — BP 128/76 | HR 73 | Temp 97.1°F | Ht 64.0 in | Wt 230.8 lb

## 2022-05-10 DIAGNOSIS — I10 Essential (primary) hypertension: Secondary | ICD-10-CM | POA: Diagnosis not present

## 2022-05-10 DIAGNOSIS — M25561 Pain in right knee: Secondary | ICD-10-CM | POA: Diagnosis not present

## 2022-05-10 DIAGNOSIS — R058 Other specified cough: Secondary | ICD-10-CM | POA: Diagnosis not present

## 2022-05-10 DIAGNOSIS — J3089 Other allergic rhinitis: Secondary | ICD-10-CM

## 2022-05-10 MED ORDER — NAPROXEN 500 MG PO TABS: 500.0000 mg | ORAL_TABLET | Freq: Two times a day (BID) | ORAL | 0 refills | Status: AC

## 2022-05-10 MED ORDER — MONTELUKAST SODIUM 10 MG PO TABS: 10.0000 mg | ORAL_TABLET | Freq: Every day | ORAL | 2 refills | Status: DC

## 2022-05-10 MED ORDER — AMLODIPINE BESYLATE 5 MG PO TABS: 5.0000 mg | ORAL_TABLET | Freq: Every day | ORAL | 0 refills | Status: DC

## 2022-05-10 NOTE — Patient Instructions (Addendum)
Welcome to [facility]! It was a pleasure meeting you today.  As discussed, Please schedule a 1 month follow up visit today.  For blood pressure - stop the HCTZ, switch to amlodipine 5 mg daily. Call if any lower leg swelling starts up.   For allergies and dry cough - start on Singulair 10 mg at bedtime. You may continue or discontinue Zyrtec with this medication. Use nasal saline. Humidifier in your room at night may also be helpful.  For right knee pain - take Naproxen with food x 10 day. Rest, ice.    PLEASE NOTE:  If you had any LAB tests please let us know if you have not heard back within a few days. You may see your results on MyChart before we have a chance to review them but we will give you a call once they are reviewed by [place]. If we ordered any REFERRALS today, please let us know if you have not heard from their office within the next two weeks. Let us know through MyChart if you are needing REFILLS, or have your pharmacy send [place] the request. You can also use MyChart to communicate with me or any office staff.  Please try these tips to maintain a healthy lifestyle:  Eat most of your calories during the day when you are active. Eliminate processed foods including packaged sweets (pies, cakes, cookies), reduce intake of potatoes, white bread, white pasta, and white rice. Look for whole grain options, oat flour or almond flour.  Each meal should contain half fruits/vegetables, one quarter protein, and one quarter carbs (no bigger than a computer mouse).  Cut down on sweet beverages. This includes juice, soda, and sweet tea. Also watch fruit intake, though this is a healthier sweet option, it still contains natural sugar! Limit to 3 servings daily.  Drink at least 1 glass of water with each meal and aim for at least 8 glasses (64 ounces) per day.  Exercise at least 150 minutes every week to the best of your ability.    Take Care,  Vinayak Bobier,  PA-C

## 2022-05-10 NOTE — Progress Notes (Unsigned)
Subjective:    Patient ID: Sandra Barker, female    DOB: 13-Sep-1975, 47 y.o.   MRN: [id number]  Chief Complaint  Patient presents with   New Patient (Initial Visit)    New pt in office to establish care with PCP; pt has swelling in right knee and given some issues she wants to discuss; also having a recurrent dry cough that she can't get rid of; sinus and allergies just started becoming in issues in the past year; possible carpel tunnel in both wrist and wears braces on both due to work and typing. Pt scheduled for first colonoscopy, just completed mammorgram and pap smear    HPI 47 y.o. patient presents today for new patient establishment with me.  Patient was previously established with provider at [facility].  Current Care Team: Dr. Terri Piedra - GYN - tramadol for heavy /painful cycles the first day or two  Dr. Collene Mares - GI - colonoscopy scheduled next Monday    Acute Concerns: R knee swelling in the last few weeks - heat / hot / cold patches; NKI; stairs at her apt complex; part-time with [employer] - walks often; no redness or heat; no issues with knees previously   Allergies / dry cough ongoing; recurrent sinus infections (two last year & one this year); no asthma hx; no eczema hx; taking Cetirizine & nasal saline; no acid reflux she knows of   Past Medical History:  Diagnosis Date   Anemia    Arthritis    Hypertension     Past Surgical History:  Procedure Laterality Date   ANKLE SURGERY     CHOLECYSTECTOMY     FRACTURE SURGERY      Family History  Problem Relation Age of Onset   COPD Mother    Hypertension Mother    Heart disease Father    Stroke Father    Hypertension Father    Alcohol abuse Father    Depression Father    Drug abuse Father    Hyperlipidemia Maternal Grandmother    Hypertension Maternal Grandmother    Heart disease Maternal Grandfather    Hypertension Maternal Grandfather    Diabetes Paternal Grandmother     Social History   Tobacco Use    Smoking status: Former    Packs/day: 0.25    Years: 10.00    Total pack years: 2.50    Types: Cigarettes    Quit date: 03/16/2019    Years since quitting: 3.1  Vaping Use   Vaping Use: Never used  Substance Use Topics   Alcohol use: Yes    Alcohol/week: 2.0 standard drinks of alcohol    Types: 2 Glasses of wine per week    Comment: occas   Drug use: No     No Known Allergies  Review of Systems NEGATIVE UNLESS OTHERWISE INDICATED IN HPI      Objective:     BP 128/76 (BP Location: Left Arm)   Pulse 73   Temp (!) 97.1 F (36.2 C) (Temporal)   Ht '5\' 4"'$  (1.626 m)   Wt 230 lb 12.8 oz (104.7 kg)   LMP 04/14/2022 (Exact Date)   SpO2 98%   BMI 39.62 kg/m   Wt Readings from Last 3 Encounters:  05/10/22 230 lb 12.8 oz (104.7 kg)  04/16/14 202 lb (91.6 kg)  04/02/14 207 lb 6.4 oz (94.1 kg)    BP Readings from Last 3 Encounters:  05/10/22 128/76  04/16/14 140/80  04/02/14 130/78  Physical Exam Vitals and nursing note reviewed.  Constitutional:      Appearance: Normal appearance. She is normal weight. She is not toxic-appearing.  HENT:     Head: Normocephalic and atraumatic.     Right Ear: External ear normal.     Left Ear: External ear normal.  Eyes:     Extraocular Movements: Extraocular movements intact.     Conjunctiva/sclera: Conjunctivae normal.     Pupils: Pupils are equal, round, and reactive to light.  Cardiovascular:     Rate and Rhythm: Normal rate and regular rhythm.     Pulses: Normal pulses.     Heart sounds: Normal heart sounds.  Pulmonary:     Effort: Pulmonary effort is normal.     Breath sounds: Normal breath sounds.  Musculoskeletal:        General: Swelling (minimal diffuse R knee edema; no redness or heat to touch; normal ROM) present. Normal range of motion.     Cervical back: Normal range of motion and neck supple.  Skin:    General: Skin is warm and dry.     Findings: No rash.  Neurological:     General: No focal deficit present.      Mental Status: She is alert and oriented to person, place, and time.  Psychiatric:        Mood and Affect: Mood normal.        Behavior: Behavior normal.        Thought Content: Thought content normal.        Judgment: Judgment normal.        Assessment & Plan:  Essential hypertension  Non-seasonal allergic rhinitis, unspecified trigger  Acute pain of right knee  Dry cough  Other orders -     Naproxen; Take 1 tablet (500 mg total) by mouth 2 (two) times daily with a meal for 10 days.  Dispense: 20 tablet; Refill: 0 -     Montelukast Sodium; Take 1 tablet (10 mg total) by mouth at bedtime.  Dispense: 30 tablet; Refill: 2 -     amLODIPine Besylate; Take 1 tablet (5 mg total) by mouth daily.  Dispense: 30 tablet; Refill: 0    For blood pressure - stop the HCTZ (due to frequent urination SE), switch to amlodipine 5 mg daily. Call if any lower leg swelling starts up.   For allergies and dry cough - start on Singulair 10 mg at bedtime. You may continue or discontinue Zyrtec with this medication. Use nasal saline. Humidifier in your room at night may also be helpful.  For right knee pain - take Naproxen with food x 10 day. Rest, ice.   Pt aware of risks vs benefits and possible adverse reactions.    Return in about 4 weeks (around 06/07/2022) for Recheck / med check .  This note was prepared with assistance of Systems analyst. Occasional wrong-word or sound-a-like substitutions may have occurred due to the inherent limitations of voice recognition software.    Mattia Liford M Shamere Campas, PA-C

## 2022-05-17 DIAGNOSIS — Z1211 Encounter for screening for malignant neoplasm of colon: Secondary | ICD-10-CM | POA: Diagnosis not present

## 2022-05-17 DIAGNOSIS — K648 Other hemorrhoids: Secondary | ICD-10-CM | POA: Diagnosis not present

## 2022-05-17 DIAGNOSIS — K5730 Diverticulosis of large intestine without perforation or abscess without bleeding: Secondary | ICD-10-CM | POA: Diagnosis not present

## 2022-05-17 DIAGNOSIS — K3189 Other diseases of stomach and duodenum: Secondary | ICD-10-CM | POA: Diagnosis not present

## 2022-05-17 DIAGNOSIS — D509 Iron deficiency anemia, unspecified: Secondary | ICD-10-CM | POA: Diagnosis not present

## 2022-05-17 LAB — HM COLONOSCOPY

## 2022-05-29 DIAGNOSIS — I10 Essential (primary) hypertension: Secondary | ICD-10-CM | POA: Diagnosis not present

## 2022-05-29 DIAGNOSIS — J0101 Acute recurrent maxillary sinusitis: Secondary | ICD-10-CM | POA: Diagnosis not present

## 2022-05-29 DIAGNOSIS — Z681 Body mass index (BMI) 19 or less, adult: Secondary | ICD-10-CM | POA: Diagnosis not present

## 2022-05-29 DIAGNOSIS — R051 Acute cough: Secondary | ICD-10-CM | POA: Diagnosis not present

## 2022-06-01 ENCOUNTER — Other Ambulatory Visit: Payer: Self-pay | Admitting: Physician Assistant

## 2022-06-01 DIAGNOSIS — D509 Iron deficiency anemia, unspecified: Secondary | ICD-10-CM | POA: Diagnosis not present

## 2022-06-01 DIAGNOSIS — K5730 Diverticulosis of large intestine without perforation or abscess without bleeding: Secondary | ICD-10-CM | POA: Diagnosis not present

## 2022-06-01 DIAGNOSIS — R635 Abnormal weight gain: Secondary | ICD-10-CM | POA: Diagnosis not present

## 2022-06-01 DIAGNOSIS — I10 Essential (primary) hypertension: Secondary | ICD-10-CM | POA: Diagnosis not present

## 2022-06-10 ENCOUNTER — Encounter: Payer: Self-pay | Admitting: Physician Assistant

## 2022-06-10 ENCOUNTER — Ambulatory Visit (HOSPITAL_COMMUNITY): Admission: RE | Admit: 2022-06-10 | Payer: 59 | Source: Ambulatory Visit | Admitting: Gastroenterology

## 2022-06-10 ENCOUNTER — Ambulatory Visit: Payer: 59 | Admitting: Physician Assistant

## 2022-06-10 ENCOUNTER — Encounter (HOSPITAL_COMMUNITY): Admission: RE | Payer: Self-pay | Source: Ambulatory Visit

## 2022-06-10 VITALS — BP 118/80 | HR 80 | Temp 97.5°F | Ht 64.0 in | Wt 230.6 lb

## 2022-06-10 DIAGNOSIS — E119 Type 2 diabetes mellitus without complications: Secondary | ICD-10-CM | POA: Diagnosis not present

## 2022-06-10 DIAGNOSIS — M25561 Pain in right knee: Secondary | ICD-10-CM | POA: Diagnosis not present

## 2022-06-10 DIAGNOSIS — D508 Other iron deficiency anemias: Secondary | ICD-10-CM

## 2022-06-10 DIAGNOSIS — J3089 Other allergic rhinitis: Secondary | ICD-10-CM | POA: Diagnosis not present

## 2022-06-10 DIAGNOSIS — I10 Essential (primary) hypertension: Secondary | ICD-10-CM

## 2022-06-10 HISTORY — DX: Type 2 diabetes mellitus without complications: E11.9

## 2022-06-10 SURGERY — IMAGING PROCEDURE, GI TRACT, INTRALUMINAL, VIA CAPSULE
Anesthesia: LOCAL

## 2022-06-10 MED ORDER — METFORMIN HCL ER 500 MG PO TB24: 500.0000 mg | ORAL_TABLET | Freq: Every evening | ORAL | 2 refills | Status: DC

## 2022-06-10 NOTE — Progress Notes (Signed)
Subjective:    Patient ID: Sandra Barker, female    DOB: 1976/03/15, 47 y.o.   MRN: [id number]  Chief Complaint  Patient presents with   Follow-up    Pt in office for follow up; pt has had another sinus infection since seeing [place] and just finished antibiotic. Pt says new bp meds seems to be working well.     HPI Patient is in today for recheck from previous visit. See A/P for details.   Past Medical History:  Diagnosis Date   Anemia    Arthritis    Hypertension    Type 2 diabetes mellitus without complication, without [place]-term current use of insulin ([place]) 06/10/2022    Past Surgical History:  Procedure Laterality Date   ANKLE SURGERY     CHOLECYSTECTOMY     FRACTURE SURGERY      Family History  Problem Relation Age of Onset   COPD Mother    Hypertension Mother    Heart disease Father    Stroke Father    Hypertension Father    Alcohol abuse Father    Depression Father    Drug abuse Father    Hyperlipidemia Maternal Grandmother    Hypertension Maternal Grandmother    Heart disease Maternal Grandfather    Hypertension Maternal Grandfather    Diabetes Paternal Grandmother     Social History   Tobacco Use   Smoking status: Former    Packs/day: 0.25    Years: 10.00    Additional pack years: 0.00    Total pack years: 2.50    Types: Cigarettes    Quit date: 03/16/2019    Years since quitting: 3.2  Vaping Use   Vaping Use: Never used  Substance Use Topics   Alcohol use: Yes    Alcohol/week: 2.0 standard drinks of alcohol    Types: 2 Glasses of wine per week    Comment: occas   Drug use: No     No Known Allergies  Review of Systems NEGATIVE UNLESS OTHERWISE INDICATED IN HPI      Objective:     BP 118/80 (BP Location: Left Arm)   Pulse 80   Temp (!) 97.5 F (36.4 C) (Temporal)   Ht 5\' 4"  (1.626 m)   Wt 230 lb 9.6 oz (104.6 kg)   SpO2 98%   BMI 39.58 kg/m   Wt Readings from Last 3 Encounters:  06/10/22 230 lb 9.6 oz (104.6 kg)  05/10/22 230  lb 12.8 oz (104.7 kg)  04/16/14 202 lb (91.6 kg)    BP Readings from Last 3 Encounters:  06/10/22 118/80  05/10/22 128/76  04/16/14 140/80     Physical Exam Vitals and nursing note reviewed.  Constitutional:      Appearance: Normal appearance.  Eyes:     Extraocular Movements: Extraocular movements intact.     Conjunctiva/sclera: Conjunctivae normal.     Pupils: Pupils are equal, round, and reactive to light.  Cardiovascular:     Rate and Rhythm: Normal rate and regular rhythm.  Pulmonary:     Effort: Pulmonary effort is normal.     Breath sounds: Normal breath sounds.  Neurological:     General: No focal deficit present.     Mental Status: She is alert and oriented to person, place, and time.  Psychiatric:        Mood and Affect: Mood normal.        Behavior: Behavior normal.        Assessment & Plan:  Type 2 diabetes mellitus without complication, without [place]-term current use of insulin ([place]) Assessment & Plan: Your Ha1c was 6.7%- just barely pushing you into diabetic range. See you back in 3 months to recheck this number. Start on Metformin 500 mg one tablet in the evenings. If tolerating well, you can increase to one tab twice daily after a few weeks. Work on increasing exercise / working on dietary changes.   HTN, goal below 140/80 Assessment & Plan: Normal, stable, cont amlodipine 5 mg daily   Other iron deficiency anemia Assessment & Plan: Try Slow Release iron OTC every other day - take with Vit C - to help iron deficiency.   Recent colonoscopy / endoscopy negative for sources. Likely 2/2 menstruation.    Non-seasonal allergic rhinitis, unspecified trigger Assessment & Plan: Continue Singulair 10 mg daily and nasal saline for allergies.    Acute pain of right knee -     Ambulatory referral to Sports Medicine  Other orders -     metFORMIN HCl ER; Take 1 tablet (500 mg total) by mouth every evening.  Dispense: 30 tablet; Refill: 2    Ongoing  R knee pain despite Naproxen, rest, ice. Will have her see sports med at this time.     Return in about 3 months (around 09/10/2022) for recheck .   Sharice Harriss M Zadrian Mccauley, PA-C

## 2022-06-10 NOTE — Patient Instructions (Addendum)
Good to see you today!  Your Ha1c was 6.7%- just barely pushing you into diabetic range. See you back in 3 months to recheck this number. Start on Metformin one tablet in the evenings. If tolerating well, you can increase to one tab twice daily after a few weeks. Work on increasing exercise / working on dietary changes.  Blood pressure is great! Continue amlodipine 5 mg daily.   Try Slow Release iron OTC every other day - take with Vit C - to help iron deficiency.   Continue Singulair 10 mg daily and nasal saline for allergies.   Referral to our awesome sports med team to check out your knee.

## 2022-06-10 NOTE — Assessment & Plan Note (Signed)
Normal, stable, cont amlodipine 5 mg daily

## 2022-06-10 NOTE — Assessment & Plan Note (Signed)
Your Ha1c was 6.7%- just barely pushing you into diabetic range. See you back in 3 months to recheck this number. Start on Metformin 500 mg one tablet in the evenings. If tolerating well, you can increase to one tab twice daily after a few weeks. Work on increasing exercise / working on dietary changes.

## 2022-06-10 NOTE — Assessment & Plan Note (Signed)
Try Slow Release iron OTC every other day - take with Vit C - to help iron deficiency.   Recent colonoscopy / endoscopy negative for sources. Likely 2/2 menstruation.

## 2022-06-10 NOTE — Assessment & Plan Note (Signed)
Continue Singulair 10 mg daily and nasal saline for allergies.

## 2022-06-21 DIAGNOSIS — R635 Abnormal weight gain: Secondary | ICD-10-CM | POA: Insufficient documentation

## 2022-06-21 NOTE — Progress Notes (Unsigned)
    Aleen Sells D.[facility] [address] [place] [address] Phone: [phone]   Assessment and Plan:     There are no diagnoses linked to this encounter.  ***   Pertinent previous records reviewed include ***   Follow Up: ***     Subjective:   I, Jonnelle Lawniczak, am serving as a Neurosurgeon for Doctor Richardean Sale  Chief Complaint: right knee pain   HPI:   06/22/2022 Patient is a 47 year old female complaining of right knee pain. Patient states  Relevant Historical Information: ***  Additional pertinent review of systems negative.   Current Outpatient Medications:    amLODipine (NORVASC) 5 MG tablet, TAKE 1 TABLET (5 MG TOTAL) BY MOUTH DAILY., Disp: 90 tablet, Rfl: 1   Cetirizine HCl (ZYRTEC ALLERGY) 10 MG CAPS, Take 10 mg by mouth daily as needed., Disp: , Rfl:    ferrous sulfate 325 (65 FE) MG tablet, Take 45 mg by mouth daily with breakfast., Disp: , Rfl:    metFORMIN (GLUCOPHAGE-XR) 500 MG 24 hr tablet, Take 1 tablet (500 mg total) by mouth every evening., Disp: 30 tablet, Rfl: 2   montelukast (SINGULAIR) 10 MG tablet, Take 1 tablet (10 mg total) by mouth at bedtime., Disp: 30 tablet, Rfl: 2   Multiple Vitamin (MULTIVITAMIN) capsule, Take 1 capsule by mouth daily., Disp: , Rfl:    traMADol (ULTRAM) 50 MG tablet, Take 50 mg by mouth 2 (two) times daily., Disp: , Rfl:    Objective:     There were no vitals filed for this visit.    There is no height or weight on file to calculate BMI.    Physical Exam:    ***   Electronically signed by:  Aleen Sells D.[facility] 9:14 AM 06/21/22

## 2022-06-22 ENCOUNTER — Ambulatory Visit: Payer: 59 | Admitting: Sports Medicine

## 2022-06-22 ENCOUNTER — Ambulatory Visit (INDEPENDENT_AMBULATORY_CARE_PROVIDER_SITE_OTHER): Payer: 59

## 2022-06-22 VITALS — BP 118/82 | HR 82 | Ht 64.0 in | Wt 231.0 lb

## 2022-06-22 DIAGNOSIS — M222X1 Patellofemoral disorders, right knee: Secondary | ICD-10-CM

## 2022-06-22 DIAGNOSIS — G8929 Other chronic pain: Secondary | ICD-10-CM

## 2022-06-22 DIAGNOSIS — M25561 Pain in right knee: Secondary | ICD-10-CM

## 2022-06-22 MED ORDER — METHYLPREDNISOLONE 4 MG PO TBPK: ORAL_TABLET | ORAL | 0 refills | Status: AC

## 2022-06-22 NOTE — Patient Instructions (Addendum)
Good to see you Prednisone dos pak  Knee HEP  3-4 week follow up

## 2022-07-12 NOTE — Progress Notes (Unsigned)
    Sandra Barker D.[facility] [address] [place] [address] Phone: [phone]   Assessment and Plan:     There are no diagnoses linked to this encounter.  ***   Pertinent previous records reviewed include ***   Follow Up: ***     Subjective:   I, Robert Sunga, am serving as a Neurosurgeon for Doctor Richardean Sale   Chief Complaint: right knee pain    HPI:    06/22/2022 Patient is a 47 year old female complaining of right knee pain. Patient states that she had some swelling, pain for a couple of months, no MOI, no radiating pain, she will hear a pop when she is sitting and some days it is painful , her knee feels heavy some days, she has a hard time going up and down steps, no meds for the pain , naproxen for 10 days, no numbness or swelling   07/13/2022 Patient states    Relevant Historical Information: DM type II, hypertension  Additional pertinent review of systems negative.   Current Outpatient Medications:    amLODipine (NORVASC) 5 MG tablet, TAKE 1 TABLET (5 MG TOTAL) BY MOUTH DAILY., Disp: 90 tablet, Rfl: 1   Cetirizine HCl (ZYRTEC ALLERGY) 10 MG CAPS, Take 10 mg by mouth daily as needed., Disp: , Rfl:    ferrous sulfate 325 (65 FE) MG tablet, Take 45 mg by mouth daily with breakfast., Disp: , Rfl:    metFORMIN (GLUCOPHAGE-XR) 500 MG 24 hr tablet, Take 1 tablet (500 mg total) by mouth every evening., Disp: 30 tablet, Rfl: 2   methylPREDNISolone (MEDROL DOSEPAK) 4 MG TBPK tablet, Take 6 tablets on day 1.  Take 5 tablets on day 2.  Take 4 tablets on day 3.  Take 3 tablets on day 4.  Take 2 tablets on day 5.  Take 1 tablet on day 6., Disp: 21 tablet, Rfl: 0   montelukast (SINGULAIR) 10 MG tablet, Take 1 tablet (10 mg total) by mouth at bedtime., Disp: 30 tablet, Rfl: 2   Multiple Vitamin (MULTIVITAMIN) capsule, Take 1 capsule by mouth daily., Disp: , Rfl:    traMADol (ULTRAM) 50 MG tablet, Take 50 mg by mouth 2 (two) times daily.,  Disp: , Rfl:    Objective:     There were no vitals filed for this visit.    There is no height or weight on file to calculate BMI.    Physical Exam:    ***   Electronically signed by:  Sandra Barker D.[facility] 11:59 AM 07/12/22

## 2022-07-13 ENCOUNTER — Ambulatory Visit: Payer: 59 | Admitting: Sports Medicine

## 2022-07-13 VITALS — BP 130/78 | HR 79 | Ht 64.0 in | Wt 235.0 lb

## 2022-07-13 DIAGNOSIS — G8929 Other chronic pain: Secondary | ICD-10-CM | POA: Diagnosis not present

## 2022-07-13 DIAGNOSIS — M25561 Pain in right knee: Secondary | ICD-10-CM | POA: Diagnosis not present

## 2022-07-13 DIAGNOSIS — M222X1 Patellofemoral disorders, right knee: Secondary | ICD-10-CM

## 2022-07-13 NOTE — Patient Instructions (Signed)
Good to see you Tylenol as needed for day to day pain Continue HEP  Pt referral  4 week follow up

## 2022-07-30 ENCOUNTER — Other Ambulatory Visit: Payer: Self-pay | Admitting: Physician Assistant

## 2022-08-04 ENCOUNTER — Ambulatory Visit: Payer: 59

## 2022-08-11 ENCOUNTER — Encounter: Payer: Self-pay | Admitting: Physician Assistant

## 2022-08-27 ENCOUNTER — Other Ambulatory Visit: Payer: Self-pay | Admitting: Physician Assistant

## 2022-09-14 ENCOUNTER — Ambulatory Visit: Payer: 59 | Admitting: Physician Assistant

## 2022-10-08 DIAGNOSIS — U071 COVID-19: Secondary | ICD-10-CM | POA: Diagnosis not present

## 2022-10-08 DIAGNOSIS — Z6839 Body mass index (BMI) 39.0-39.9, adult: Secondary | ICD-10-CM | POA: Diagnosis not present

## 2022-10-08 DIAGNOSIS — R051 Acute cough: Secondary | ICD-10-CM | POA: Diagnosis not present

## 2022-10-21 ENCOUNTER — Other Ambulatory Visit: Payer: Self-pay | Admitting: Physician Assistant

## 2022-11-18 ENCOUNTER — Other Ambulatory Visit: Payer: Self-pay | Admitting: Physician Assistant

## 2023-01-17 ENCOUNTER — Other Ambulatory Visit: Payer: Self-pay | Admitting: Physician Assistant

## 2023-03-13 ENCOUNTER — Other Ambulatory Visit: Payer: Self-pay | Admitting: Physician Assistant

## 2023-07-31 IMAGING — MG MM DIGITAL DIAGNOSTIC UNILAT*R* W/ TOMO W/ CAD
4 series · 4 of 12 positions shown · non-contrast
Comparison: Previous exam(s).

CLINICAL DATA: Patient returns after screening for evaluation
possible RIGHT breast distortion.

EXAM:
DIGITAL DIAGNOSTIC UNILATERAL RIGHT MAMMOGRAM WITH TOMOSYNTHESIS AND
CAD
TECHNIQUE: Right digital diagnostic mammography and breast tomosynthesis was
performed. The images were evaluated with computer-aided detection.

[R CC synth-2D]
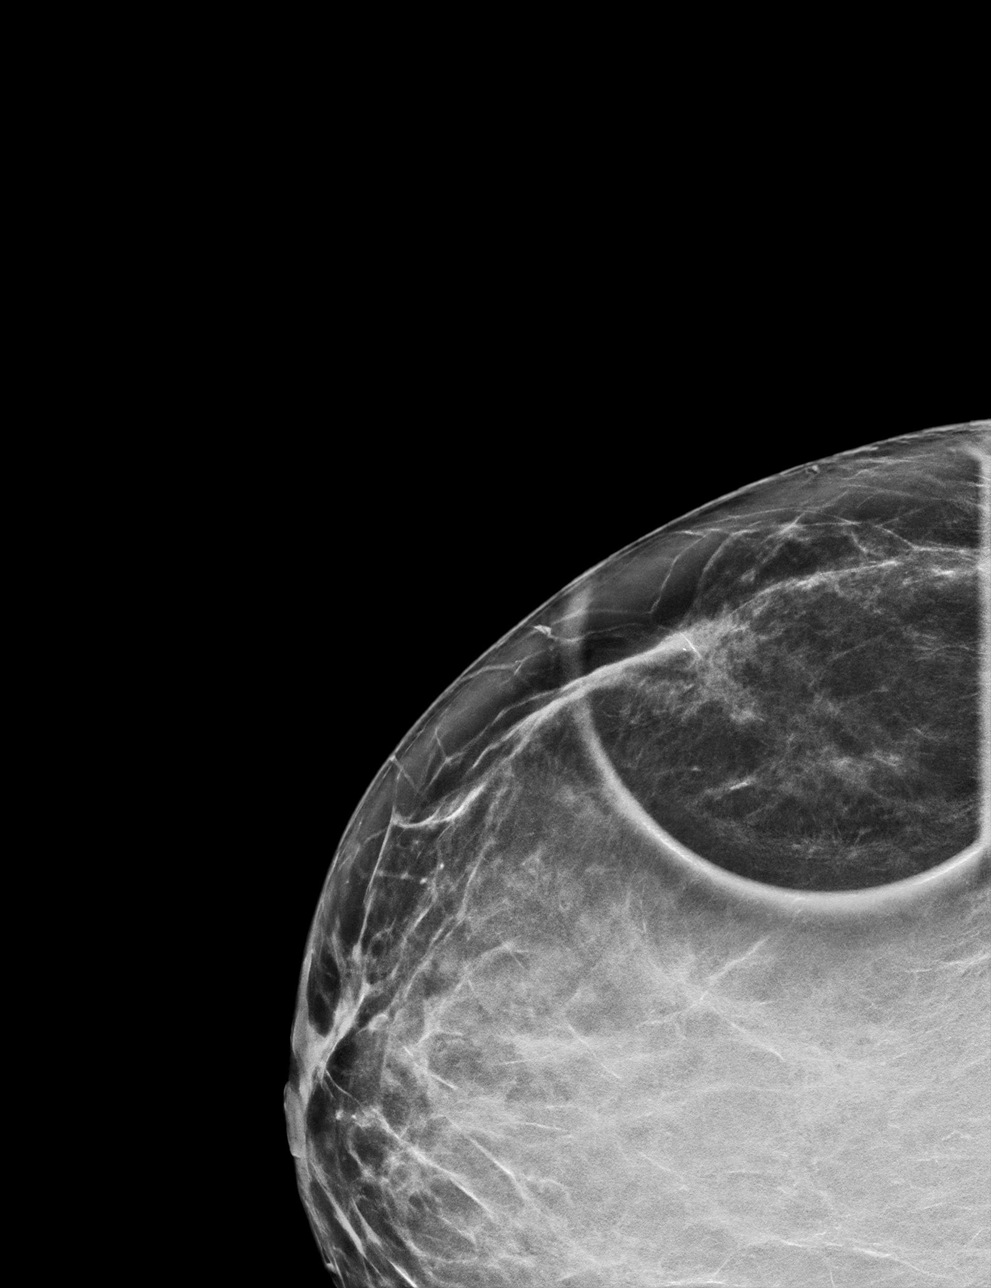

[R ML synth-2D]
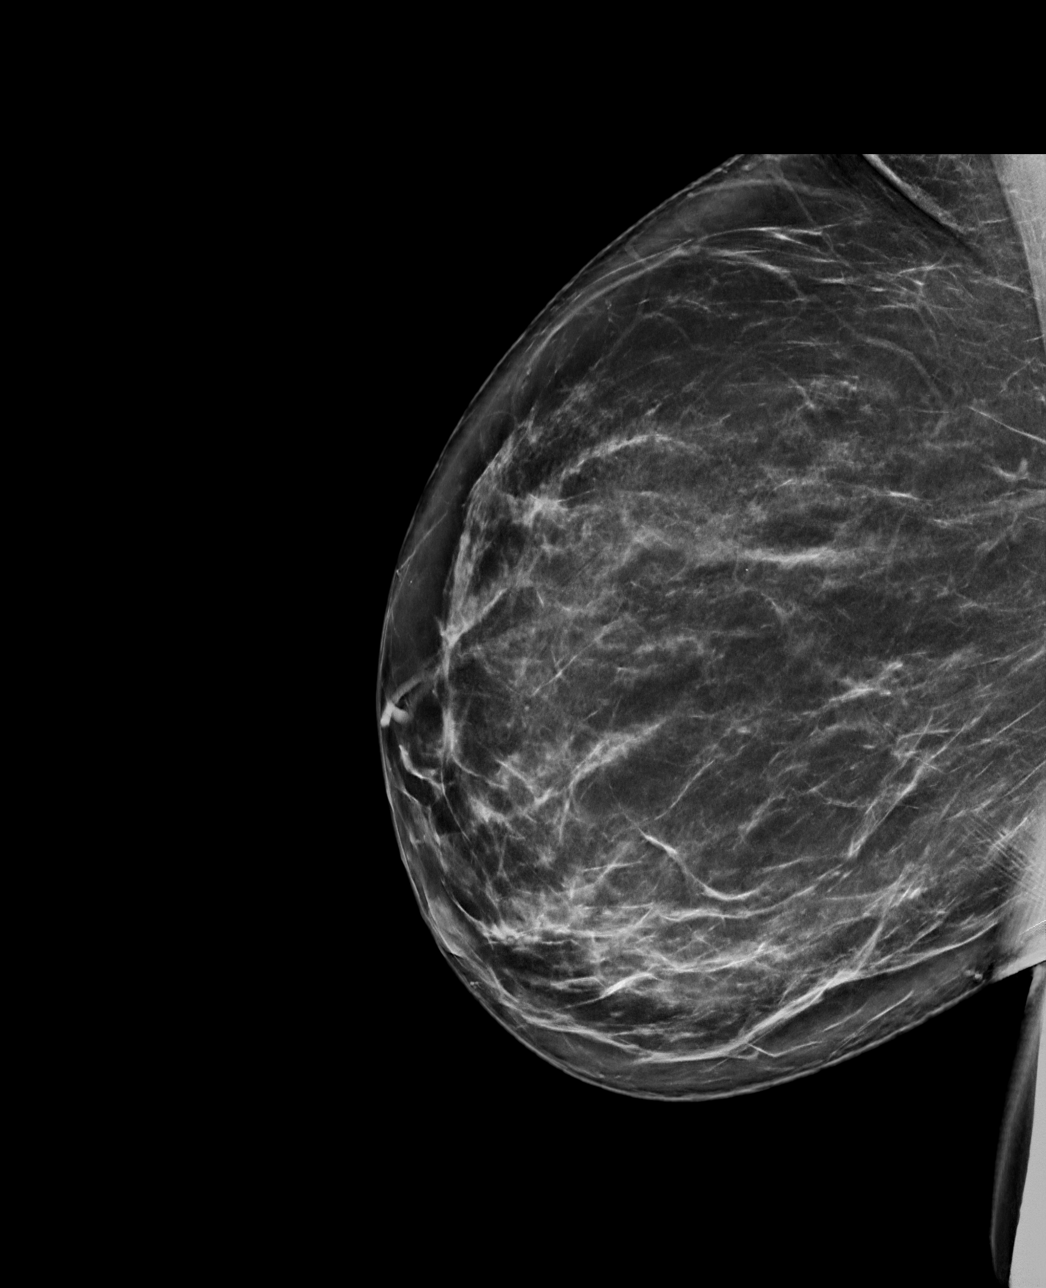

[R ML tomo · tomo slice 45/89.0]
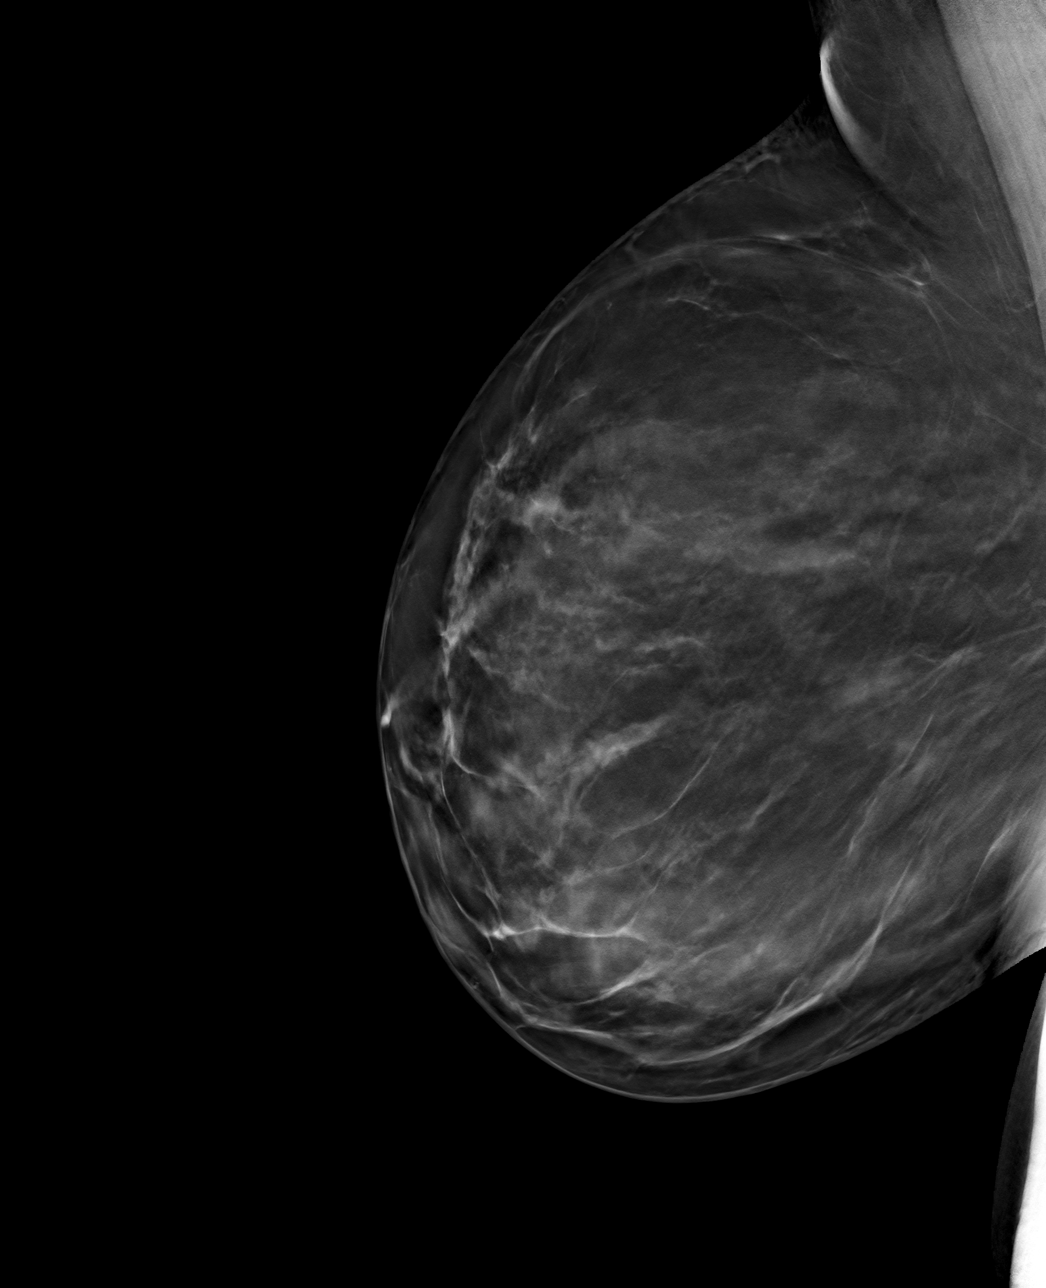

[R CC tomo · tomo slice 35/70.0]
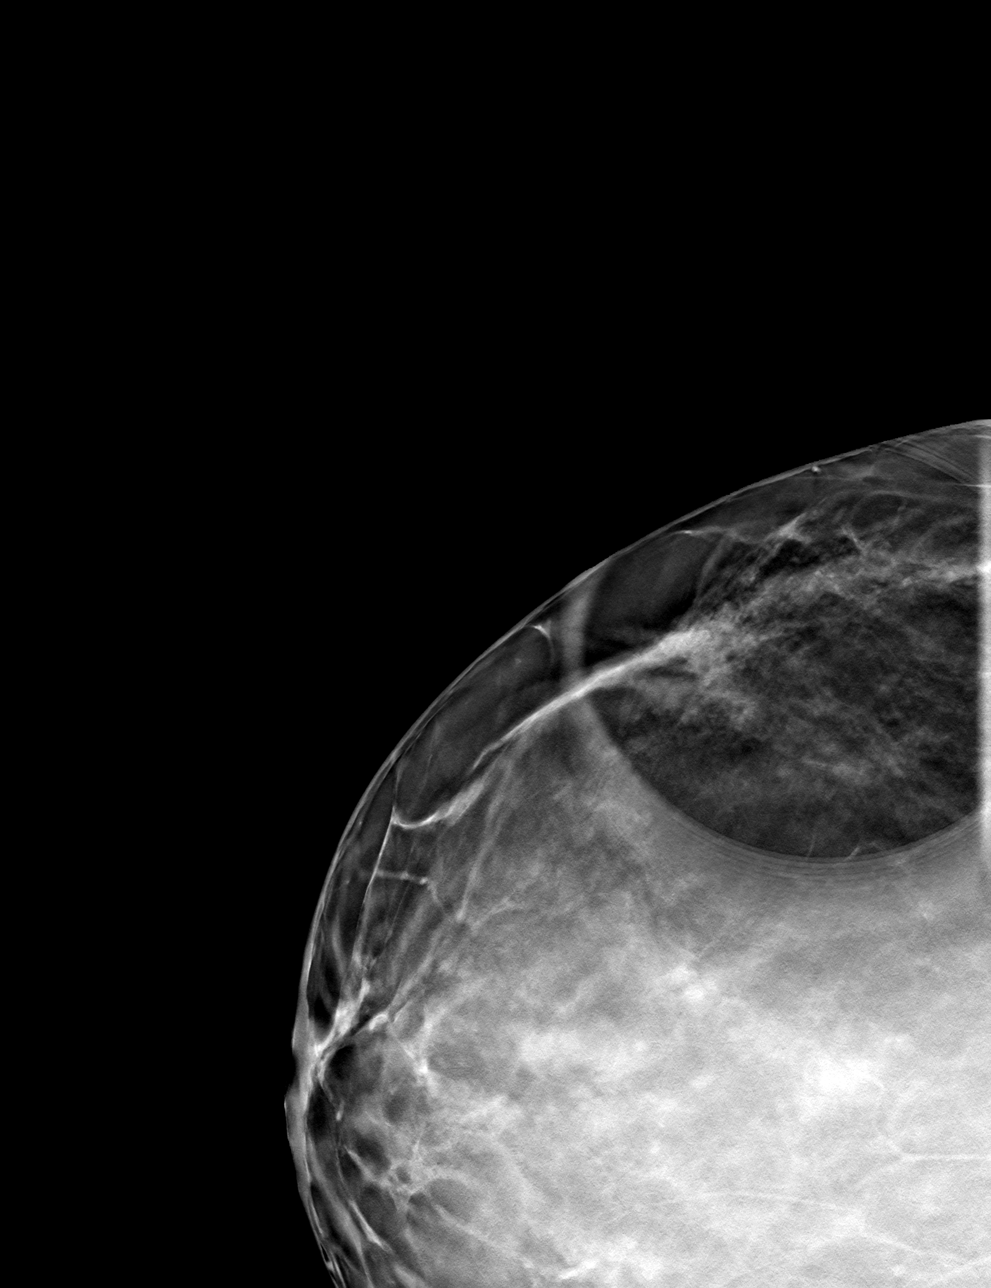

[4 of 12 positions shown; findings below may reference images not displayed]

ACR Breast Density Category c: The breast tissue is heterogeneously
dense, which may obscure small masses.
FINDINGS: Additional 2-D and 3-D images are performed. These views show no
persistent distortion or mass in the LATERAL portion of the RIGHT
breast. No suspicious mass, distortion, or microcalcifications are
identified to suggest presence of malignancy.
IMPRESSION: No mammographic evidence for malignancy.

RECOMMENDATION:
Screening mammogram in one year.(Code:6C-[id number])

I have discussed the findings and recommendations with the patient.
If applicable, a reminder letter will be sent to the patient
regarding the next appointment.

BI-RADS CATEGORY  1: Negative.

## 2024-01-06 ENCOUNTER — Other Ambulatory Visit (HOSPITAL_BASED_OUTPATIENT_CLINIC_OR_DEPARTMENT_OTHER): Payer: Self-pay

## 2024-01-06 MED ORDER — VALACYCLOVIR HCL 1 G PO TABS
1000.0000 mg | ORAL_TABLET | Freq: Three times a day (TID) | ORAL | 0 refills | Status: AC
  Filled 2024-01-06: qty 21, 7d supply, fill #0

## 2024-01-06 MED ORDER — OXYCODONE-ACETAMINOPHEN 5-325 MG PO TABS
1.0000 | ORAL_TABLET | Freq: Four times a day (QID) | ORAL | 0 refills | Status: AC
  Filled 2024-01-06: qty 20, 5d supply, fill #0

## 2024-01-11 ENCOUNTER — Other Ambulatory Visit (HOSPITAL_COMMUNITY): Payer: Self-pay

## 2024-01-11 MED ORDER — AMLODIPINE BESYLATE 5 MG PO TABS
5.0000 mg | ORAL_TABLET | Freq: Every day | ORAL | 0 refills | Status: AC
  Filled 2024-01-11 – 2024-01-12 (×2): qty 90, 90d supply, fill #0

## 2024-01-12 ENCOUNTER — Other Ambulatory Visit (HOSPITAL_COMMUNITY): Payer: Self-pay

## 2024-01-12 ENCOUNTER — Other Ambulatory Visit (HOSPITAL_BASED_OUTPATIENT_CLINIC_OR_DEPARTMENT_OTHER): Payer: Self-pay

## 2024-01-13 ENCOUNTER — Other Ambulatory Visit (HOSPITAL_COMMUNITY): Payer: Self-pay

## 2024-01-14 ENCOUNTER — Other Ambulatory Visit: Payer: Self-pay

## 2024-01-14 MED ORDER — AMLODIPINE BESYLATE 5 MG PO TABS
5.0000 mg | ORAL_TABLET | Freq: Every day | ORAL | 0 refills | Status: AC
  Filled 2024-01-14: qty 90, 90d supply, fill #0

## 2024-01-16 ENCOUNTER — Other Ambulatory Visit (HOSPITAL_COMMUNITY): Payer: Self-pay

## 2024-01-17 ENCOUNTER — Other Ambulatory Visit (HOSPITAL_COMMUNITY): Payer: Self-pay

## 2024-01-19 ENCOUNTER — Other Ambulatory Visit (HOSPITAL_COMMUNITY): Payer: Self-pay

## 2024-04-02 ENCOUNTER — Other Ambulatory Visit (HOSPITAL_BASED_OUTPATIENT_CLINIC_OR_DEPARTMENT_OTHER): Payer: Self-pay

## 2024-04-02 MED ORDER — PAROXETINE HCL 10 MG PO TABS
10.0000 mg | ORAL_TABLET | Freq: Every day | ORAL | 0 refills | Status: AC
  Filled 2024-04-02: qty 30, 30d supply, fill #0

## 2024-04-03 ENCOUNTER — Other Ambulatory Visit (HOSPITAL_BASED_OUTPATIENT_CLINIC_OR_DEPARTMENT_OTHER): Payer: Self-pay

## 2024-04-04 ENCOUNTER — Other Ambulatory Visit (HOSPITAL_BASED_OUTPATIENT_CLINIC_OR_DEPARTMENT_OTHER): Payer: Self-pay
